# Patient Record
Sex: Male | Born: 1950
Health system: Southern US, Community
[De-identification: ages and names within clinical notes are randomized; demographics above are authoritative.]

## PROBLEM LIST (undated history)

## (undated) DIAGNOSIS — M1990 Unspecified osteoarthritis, unspecified site: Secondary | ICD-10-CM

## (undated) DIAGNOSIS — N183 Chronic kidney disease, stage 3 unspecified: Secondary | ICD-10-CM

## (undated) DIAGNOSIS — E785 Hyperlipidemia, unspecified: Secondary | ICD-10-CM

## (undated) DIAGNOSIS — N400 Enlarged prostate without lower urinary tract symptoms: Secondary | ICD-10-CM

## (undated) DIAGNOSIS — I6523 Occlusion and stenosis of bilateral carotid arteries: Secondary | ICD-10-CM

## (undated) DIAGNOSIS — H3322 Serous retinal detachment, left eye: Secondary | ICD-10-CM

## (undated) DIAGNOSIS — Z973 Presence of spectacles and contact lenses: Secondary | ICD-10-CM

## (undated) DIAGNOSIS — H539 Unspecified visual disturbance: Secondary | ICD-10-CM

## (undated) DIAGNOSIS — R55 Syncope and collapse: Secondary | ICD-10-CM

## (undated) DIAGNOSIS — I6501 Occlusion and stenosis of right vertebral artery: Secondary | ICD-10-CM

## (undated) DIAGNOSIS — I10 Essential (primary) hypertension: Secondary | ICD-10-CM

## (undated) DIAGNOSIS — M779 Enthesopathy, unspecified: Secondary | ICD-10-CM

## (undated) HISTORY — PX: CHOLECYSTECTOMY: SHX55

## (undated) HISTORY — PX: OTHER SURGICAL HISTORY: SHX169

## (undated) HISTORY — PX: COLONOSCOPY: SHX174

## (undated) HISTORY — DX: Enthesopathy, unspecified: M77.9

## (undated) HISTORY — DX: Unspecified visual disturbance: H53.9

## (undated) HISTORY — DX: Benign prostatic hyperplasia without lower urinary tract symptoms: N40.0

## (undated) HISTORY — DX: Hyperlipidemia, unspecified: E78.5

## (undated) HISTORY — PX: KNEE SURGERY: SHX244

## (undated) HISTORY — PX: SHOULDER SURGERY: SHX246

## (undated) HISTORY — DX: Serous retinal detachment, left eye: H33.22

## (undated) HISTORY — DX: Syncope and collapse: R55

## (undated) HISTORY — DX: Chronic kidney disease, stage 3 unspecified: N18.30

---

## 2000-04-24 ENCOUNTER — Encounter: Payer: Self-pay | Admitting: Neurosurgery

## 2000-04-24 ENCOUNTER — Ambulatory Visit (HOSPITAL_COMMUNITY): Admission: RE | Admit: 2000-04-24 | Discharge: 2000-04-24 | Payer: Self-pay | Admitting: Neurosurgery

## 2000-04-27 ENCOUNTER — Encounter: Payer: Self-pay | Admitting: Neurosurgery

## 2000-04-27 ENCOUNTER — Ambulatory Visit (HOSPITAL_COMMUNITY): Admission: RE | Admit: 2000-04-27 | Discharge: 2000-04-27 | Payer: Self-pay | Admitting: Neurosurgery

## 2000-06-07 ENCOUNTER — Encounter: Admission: RE | Admit: 2000-06-07 | Discharge: 2000-09-05 | Payer: Self-pay | Admitting: Anesthesiology

## 2000-09-20 ENCOUNTER — Encounter: Admission: RE | Admit: 2000-09-20 | Discharge: 2000-12-19 | Payer: Self-pay | Admitting: Anesthesiology

## 2010-07-26 ENCOUNTER — Emergency Department (HOSPITAL_COMMUNITY)
Admission: EM | Admit: 2010-07-26 | Discharge: 2010-07-26 | Payer: Self-pay | Source: Home / Self Care | Admitting: Emergency Medicine

## 2012-11-15 ENCOUNTER — Emergency Department (HOSPITAL_COMMUNITY)
Admission: EM | Admit: 2012-11-15 | Discharge: 2012-11-16 | Disposition: A | Discharge status: Home / Self Care | Payer: BC Managed Care – PPO | Attending: Emergency Medicine | Admitting: Emergency Medicine

## 2012-11-15 ENCOUNTER — Emergency Department (HOSPITAL_COMMUNITY): Payer: BC Managed Care – PPO

## 2012-11-15 ENCOUNTER — Encounter (HOSPITAL_COMMUNITY): Payer: Self-pay | Admitting: *Deleted

## 2012-11-15 DIAGNOSIS — S93401A Sprain of unspecified ligament of right ankle, initial encounter: Secondary | ICD-10-CM

## 2012-11-15 DIAGNOSIS — Y9389 Activity, other specified: Secondary | ICD-10-CM | POA: Insufficient documentation

## 2012-11-15 DIAGNOSIS — X500XXA Overexertion from strenuous movement or load, initial encounter: Secondary | ICD-10-CM | POA: Insufficient documentation

## 2012-11-15 DIAGNOSIS — Y929 Unspecified place or not applicable: Secondary | ICD-10-CM | POA: Insufficient documentation

## 2012-11-15 DIAGNOSIS — I10 Essential (primary) hypertension: Secondary | ICD-10-CM | POA: Insufficient documentation

## 2012-11-15 DIAGNOSIS — Z79899 Other long term (current) drug therapy: Secondary | ICD-10-CM | POA: Insufficient documentation

## 2012-11-15 DIAGNOSIS — S93409A Sprain of unspecified ligament of unspecified ankle, initial encounter: Secondary | ICD-10-CM | POA: Insufficient documentation

## 2012-11-15 HISTORY — DX: Essential (primary) hypertension: I10

## 2012-11-15 NOTE — ED Notes (Signed)
Pt stepped in a hole and twisted rt ankle/foot approx 5 hrs prior.

## 2012-11-15 NOTE — ED Provider Notes (Signed)
History     CSN: [id number]  Arrival date & time 11/15/12  2314   First MD Initiated Contact with Patient 11/15/12 2320      Chief Complaint  Patient presents with  . Ankle Pain    rt    (Consider location/radiation/quality/duration/timing/severity/associated sxs/prior treatment) Patient is a 62 y.o. male presenting with ankle pain. The history is provided by the patient.  Ankle Pain Location:  Ankle Injury: yes   Mechanism of injury: fall   Mechanism of injury comment:  Pt stepped in a hole, fell and twisted the right ankle Fall:    Fall occurred:  Down stairs   Impact surface:  Primary school teacher of impact:  Head (right shoulder) Ankle location:  R ankle Pain details:    Quality:  Aching   Radiates to:  Does not radiate   Severity:  Moderate   Onset quality:  Gradual   Timing:  Constant   Progression:  Worsening Chronicity:  New Dislocation: no   Foreign body present:  No foreign bodies Prior injury to area:  Yes Worsened by:  Bearing weight Ineffective treatments:  None tried Associated symptoms: no back pain and no neck pain   Risk factors: no frequent fractures and no recent illness     Past Medical History  Diagnosis Date  . Hypertension     Past Surgical History  Procedure Laterality Date  . Cholecystectomy    . Lt knee    . Rt shoulder      History reviewed. No pertinent family history.  History  Substance Use Topics  . Smoking status: Never Smoker   . Smokeless tobacco: Not on file  . Alcohol Use: No      Review of Systems  Constitutional: Negative for activity change.       All ROS Neg except as noted in HPI  HENT: Negative for nosebleeds and neck pain.   Eyes: Negative for photophobia and discharge.  Respiratory: Negative for cough, shortness of breath and wheezing.   Cardiovascular: Negative for chest pain and palpitations.  Gastrointestinal: Negative for abdominal pain and blood in stool.  Genitourinary: Negative for dysuria,  frequency and hematuria.  Musculoskeletal: Positive for arthralgias. Negative for back pain.  Skin: Negative.   Neurological: Negative for dizziness, seizures and speech difficulty.  Psychiatric/Behavioral: Negative for hallucinations and confusion.    Allergies  Codeine and Flexeril  Home Medications   Current Outpatient Rx  Name  Route  Sig  Dispense  Refill  . amLODipine (NORVASC) 5 MG tablet   Oral   Take 5 mg by mouth daily.         . traMADol (ULTRAM) 50 MG tablet   Oral   Take 50 mg by mouth every 8 (eight) hours as needed for pain.           BP 141/74  Temp(Src) 98.1 F (36.7 C) (Oral)  Resp 18  Ht 6' 4.5" (1.943 m)  Wt 275 lb (124.739 kg)  BMI 33.04 kg/m2  SpO2 97%  Physical Exam  Nursing note and vitals reviewed. Constitutional: He is oriented to person, place, and time. He appears well-developed and well-nourished.  Non-toxic appearance.  HENT:  Head: Normocephalic.  Right Ear: Tympanic membrane and external ear normal.  Left Ear: Tympanic membrane and external ear normal.  Eyes: EOM and lids are normal. Pupils are equal, round, and reactive to light.  Neck: Normal range of motion. Neck supple. Carotid bruit is not present.  Cardiovascular: Normal rate,  regular rhythm, normal heart sounds, intact distal pulses and normal pulses.   Pulmonary/Chest: Breath sounds normal. No respiratory distress.  Abdominal: Soft. Bowel sounds are normal. There is no tenderness. There is no guarding.  Musculoskeletal: Normal range of motion.  There is full range of motion of the right hip and knee. There is mild swelling and pain of the medial malleolus. There is full range of motion of the toes of the right foot. The dorsalis pedis pulse is 2+. The Achilles tendon is intact.  Lymphadenopathy:       Head (right side): No submandibular adenopathy present.       Head (left side): No submandibular adenopathy present.    He has no cervical adenopathy.  Neurological: He is  alert and oriented to person, place, and time. He has normal strength. No cranial nerve deficit or sensory deficit.  Skin: Skin is warm and dry.  Psychiatric: He has a normal mood and affect. His speech is normal.    ED Course  Procedures (including critical care time)  Labs Reviewed - No data to display No results found.   No diagnosis found.    MDM  I have reviewed nursing notes, vital signs, and all appropriate lab and imaging results for this patient. Patient stepped in a hole and twisted the right ankle earlier today. X-ray of the right ankle is negative for fracture or dislocation. Patient given an ice pack. Ankle stirrup splint applied. Crutches were offered but declined. Patient is to see the orthopedic specialist for followup if not improving.      Kathie Dike, PA-C 11/23/12 1520

## 2012-11-25 NOTE — ED Provider Notes (Signed)
Medical screening examination/treatment/procedure(s) were performed by non-physician practitioner and as supervising physician I was immediately available for consultation/collaboration.   Joya Gaskins, MD 11/25/12 2033

## 2013-11-12 ENCOUNTER — Encounter (INDEPENDENT_AMBULATORY_CARE_PROVIDER_SITE_OTHER): Payer: Self-pay | Admitting: *Deleted

## 2016-01-17 DIAGNOSIS — R509 Fever, unspecified: Secondary | ICD-10-CM | POA: Diagnosis not present

## 2016-01-19 DIAGNOSIS — R7989 Other specified abnormal findings of blood chemistry: Secondary | ICD-10-CM | POA: Diagnosis not present

## 2016-01-24 DIAGNOSIS — R799 Abnormal finding of blood chemistry, unspecified: Secondary | ICD-10-CM | POA: Diagnosis not present

## 2016-03-02 DIAGNOSIS — B029 Zoster without complications: Secondary | ICD-10-CM | POA: Diagnosis not present

## 2016-03-02 DIAGNOSIS — R509 Fever, unspecified: Secondary | ICD-10-CM | POA: Diagnosis not present

## 2016-03-02 DIAGNOSIS — R799 Abnormal finding of blood chemistry, unspecified: Secondary | ICD-10-CM | POA: Diagnosis not present

## 2016-07-11 DIAGNOSIS — R972 Elevated prostate specific antigen [PSA]: Secondary | ICD-10-CM | POA: Diagnosis not present

## 2016-07-11 DIAGNOSIS — N401 Enlarged prostate with lower urinary tract symptoms: Secondary | ICD-10-CM | POA: Diagnosis not present

## 2016-07-11 DIAGNOSIS — R3911 Hesitancy of micturition: Secondary | ICD-10-CM | POA: Diagnosis not present

## 2017-07-17 DIAGNOSIS — M722 Plantar fascial fibromatosis: Secondary | ICD-10-CM | POA: Diagnosis not present

## 2017-07-17 DIAGNOSIS — M79672 Pain in left foot: Secondary | ICD-10-CM | POA: Diagnosis not present

## 2017-09-11 DIAGNOSIS — M722 Plantar fascial fibromatosis: Secondary | ICD-10-CM | POA: Diagnosis not present

## 2017-10-16 DIAGNOSIS — M722 Plantar fascial fibromatosis: Secondary | ICD-10-CM | POA: Diagnosis not present

## 2017-11-08 DIAGNOSIS — I10 Essential (primary) hypertension: Secondary | ICD-10-CM | POA: Diagnosis not present

## 2017-11-08 DIAGNOSIS — Z125 Encounter for screening for malignant neoplasm of prostate: Secondary | ICD-10-CM | POA: Diagnosis not present

## 2017-11-08 DIAGNOSIS — E7849 Other hyperlipidemia: Secondary | ICD-10-CM | POA: Diagnosis not present

## 2017-11-15 DIAGNOSIS — D696 Thrombocytopenia, unspecified: Secondary | ICD-10-CM | POA: Diagnosis not present

## 2017-11-15 DIAGNOSIS — Z1389 Encounter for screening for other disorder: Secondary | ICD-10-CM | POA: Diagnosis not present

## 2017-11-15 DIAGNOSIS — E668 Other obesity: Secondary | ICD-10-CM | POA: Diagnosis not present

## 2017-11-15 DIAGNOSIS — Z6830 Body mass index (BMI) 30.0-30.9, adult: Secondary | ICD-10-CM | POA: Diagnosis not present

## 2017-11-15 DIAGNOSIS — Z0000 Encounter for general adult medical examination without abnormal findings: Secondary | ICD-10-CM | POA: Diagnosis not present

## 2017-11-15 DIAGNOSIS — Z8249 Family history of ischemic heart disease and other diseases of the circulatory system: Secondary | ICD-10-CM | POA: Diagnosis not present

## 2017-11-15 DIAGNOSIS — N400 Enlarged prostate without lower urinary tract symptoms: Secondary | ICD-10-CM | POA: Diagnosis not present

## 2017-11-15 DIAGNOSIS — M545 Low back pain: Secondary | ICD-10-CM | POA: Diagnosis not present

## 2017-11-15 DIAGNOSIS — I10 Essential (primary) hypertension: Secondary | ICD-10-CM | POA: Diagnosis not present

## 2017-11-15 DIAGNOSIS — E7849 Other hyperlipidemia: Secondary | ICD-10-CM | POA: Diagnosis not present

## 2017-11-15 DIAGNOSIS — H6123 Impacted cerumen, bilateral: Secondary | ICD-10-CM | POA: Diagnosis not present

## 2017-11-15 DIAGNOSIS — N183 Chronic kidney disease, stage 3 (moderate): Secondary | ICD-10-CM | POA: Diagnosis not present

## 2018-05-14 DIAGNOSIS — M7712 Lateral epicondylitis, left elbow: Secondary | ICD-10-CM | POA: Diagnosis not present

## 2018-05-14 DIAGNOSIS — I10 Essential (primary) hypertension: Secondary | ICD-10-CM | POA: Diagnosis not present

## 2018-05-14 DIAGNOSIS — N183 Chronic kidney disease, stage 3 (moderate): Secondary | ICD-10-CM | POA: Diagnosis not present

## 2018-05-14 DIAGNOSIS — M545 Low back pain: Secondary | ICD-10-CM | POA: Diagnosis not present

## 2018-05-14 DIAGNOSIS — D696 Thrombocytopenia, unspecified: Secondary | ICD-10-CM | POA: Diagnosis not present

## 2018-05-14 DIAGNOSIS — Z6830 Body mass index (BMI) 30.0-30.9, adult: Secondary | ICD-10-CM | POA: Diagnosis not present

## 2018-05-14 DIAGNOSIS — E7849 Other hyperlipidemia: Secondary | ICD-10-CM | POA: Diagnosis not present

## 2018-05-14 DIAGNOSIS — Z23 Encounter for immunization: Secondary | ICD-10-CM | POA: Diagnosis not present

## 2018-10-02 DIAGNOSIS — R972 Elevated prostate specific antigen [PSA]: Secondary | ICD-10-CM | POA: Diagnosis not present

## 2018-10-07 DIAGNOSIS — R972 Elevated prostate specific antigen [PSA]: Secondary | ICD-10-CM | POA: Diagnosis not present

## 2018-10-07 DIAGNOSIS — R3911 Hesitancy of micturition: Secondary | ICD-10-CM | POA: Diagnosis not present

## 2018-10-07 DIAGNOSIS — N401 Enlarged prostate with lower urinary tract symptoms: Secondary | ICD-10-CM | POA: Diagnosis not present

## 2018-11-15 DIAGNOSIS — E7849 Other hyperlipidemia: Secondary | ICD-10-CM | POA: Diagnosis not present

## 2018-11-15 DIAGNOSIS — I10 Essential (primary) hypertension: Secondary | ICD-10-CM | POA: Diagnosis not present

## 2018-11-15 DIAGNOSIS — R82998 Other abnormal findings in urine: Secondary | ICD-10-CM | POA: Diagnosis not present

## 2018-11-15 DIAGNOSIS — Z125 Encounter for screening for malignant neoplasm of prostate: Secondary | ICD-10-CM | POA: Diagnosis not present

## 2018-11-22 DIAGNOSIS — D696 Thrombocytopenia, unspecified: Secondary | ICD-10-CM | POA: Diagnosis not present

## 2018-11-22 DIAGNOSIS — Z8249 Family history of ischemic heart disease and other diseases of the circulatory system: Secondary | ICD-10-CM | POA: Diagnosis not present

## 2018-11-22 DIAGNOSIS — N183 Chronic kidney disease, stage 3 (moderate): Secondary | ICD-10-CM | POA: Diagnosis not present

## 2018-11-22 DIAGNOSIS — E669 Obesity, unspecified: Secondary | ICD-10-CM | POA: Diagnosis not present

## 2018-11-22 DIAGNOSIS — N400 Enlarged prostate without lower urinary tract symptoms: Secondary | ICD-10-CM | POA: Diagnosis not present

## 2018-11-22 DIAGNOSIS — E785 Hyperlipidemia, unspecified: Secondary | ICD-10-CM | POA: Diagnosis not present

## 2018-11-22 DIAGNOSIS — Z0000 Encounter for general adult medical examination without abnormal findings: Secondary | ICD-10-CM | POA: Diagnosis not present

## 2018-11-22 DIAGNOSIS — M545 Low back pain: Secondary | ICD-10-CM | POA: Diagnosis not present

## 2018-11-22 DIAGNOSIS — I10 Essential (primary) hypertension: Secondary | ICD-10-CM | POA: Diagnosis not present

## 2018-11-22 DIAGNOSIS — J309 Allergic rhinitis, unspecified: Secondary | ICD-10-CM | POA: Diagnosis not present

## 2018-11-22 DIAGNOSIS — Z1331 Encounter for screening for depression: Secondary | ICD-10-CM | POA: Diagnosis not present

## 2018-11-22 DIAGNOSIS — M1990 Unspecified osteoarthritis, unspecified site: Secondary | ICD-10-CM | POA: Diagnosis not present

## 2019-02-17 DIAGNOSIS — N183 Chronic kidney disease, stage 3 (moderate): Secondary | ICD-10-CM | POA: Diagnosis not present

## 2019-02-17 DIAGNOSIS — R55 Syncope and collapse: Secondary | ICD-10-CM | POA: Diagnosis not present

## 2019-02-17 DIAGNOSIS — I129 Hypertensive chronic kidney disease with stage 1 through stage 4 chronic kidney disease, or unspecified chronic kidney disease: Secondary | ICD-10-CM | POA: Diagnosis not present

## 2019-02-17 DIAGNOSIS — E785 Hyperlipidemia, unspecified: Secondary | ICD-10-CM | POA: Diagnosis not present

## 2019-02-17 DIAGNOSIS — Z8249 Family history of ischemic heart disease and other diseases of the circulatory system: Secondary | ICD-10-CM | POA: Diagnosis not present

## 2019-02-19 ENCOUNTER — Other Ambulatory Visit: Payer: Self-pay | Admitting: Internal Medicine

## 2019-02-19 ENCOUNTER — Ambulatory Visit
Admission: RE | Admit: 2019-02-19 | Discharge: 2019-02-19 | Disposition: A | Discharge status: Home / Self Care | Payer: Medicare Other | Source: Ambulatory Visit | Attending: Internal Medicine | Admitting: Internal Medicine

## 2019-02-19 ENCOUNTER — Ambulatory Visit
Admission: RE | Admit: 2019-02-19 | Discharge: 2019-02-19 | Disposition: A | Discharge status: Home / Self Care | Payer: Self-pay | Source: Ambulatory Visit | Attending: Internal Medicine | Admitting: Internal Medicine

## 2019-02-19 DIAGNOSIS — R55 Syncope and collapse: Secondary | ICD-10-CM

## 2019-02-19 DIAGNOSIS — R51 Headache: Secondary | ICD-10-CM | POA: Diagnosis not present

## 2019-02-19 DIAGNOSIS — I6501 Occlusion and stenosis of right vertebral artery: Secondary | ICD-10-CM | POA: Diagnosis not present

## 2019-02-19 DIAGNOSIS — I6523 Occlusion and stenosis of bilateral carotid arteries: Secondary | ICD-10-CM | POA: Diagnosis not present

## 2019-02-19 MED ORDER — IOPAMIDOL (ISOVUE-370) INJECTION 76%
50.0000 mL | Freq: Once | INTRAVENOUS | Status: AC | PRN
  Administered 2019-02-19: 50 mL via INTRAVENOUS

## 2019-02-21 ENCOUNTER — Encounter: Payer: Self-pay | Admitting: Cardiology

## 2019-02-21 ENCOUNTER — Ambulatory Visit (INDEPENDENT_AMBULATORY_CARE_PROVIDER_SITE_OTHER): Payer: Medicare Other | Admitting: Cardiology

## 2019-02-21 ENCOUNTER — Other Ambulatory Visit: Payer: Self-pay

## 2019-02-21 ENCOUNTER — Ambulatory Visit: Payer: Medicare Other

## 2019-02-21 VITALS — BP 145/70 | HR 61 | Temp 98.7°F | Ht 76.0 in | Wt 250.7 lb

## 2019-02-21 DIAGNOSIS — I10 Essential (primary) hypertension: Secondary | ICD-10-CM | POA: Diagnosis not present

## 2019-02-21 DIAGNOSIS — R42 Dizziness and giddiness: Secondary | ICD-10-CM

## 2019-02-21 DIAGNOSIS — E782 Mixed hyperlipidemia: Secondary | ICD-10-CM

## 2019-02-21 DIAGNOSIS — R55 Syncope and collapse: Secondary | ICD-10-CM

## 2019-02-21 MED ORDER — ATORVASTATIN CALCIUM 10 MG PO TABS
10.0000 mg | ORAL_TABLET | Freq: Every day | ORAL | 3 refills | Status: DC
Start: 2019-02-21 — End: 2019-07-17

## 2019-02-21 NOTE — Progress Notes (Signed)
Patient referred by [name], MD for presyncope  Subjective:   Steven Padilla, male    DOB: [birth year], 68 y.o.   MRN: [id number]   Chief Complaint  Patient presents with  . Presyncope  . Hypertension  . New Patient (Initial Visit)   HPI  68 y.o. Caucasian male with hypertension, hyperlipidemia, family h/o brain aneurysm, referred for evaluation of presyncope.  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 With this level of activity, he does not have any chest pain. shortness of breath symptoms. A few weeks ago, he was working on fixing a cooler, looking overhead, when he had sudden onset of blackout episode. He did not lose consciousness or fall to the ground. He was able to hold onto a scaffolding. The episode resolved in 10-15 seconds. He felt tired rest of the day. He underwent CTA head and neck, which showed mild atherosclerosis, but no specific etiology for his episodes.   Past Medical History:  Diagnosis Date  . Hypertension      Past Surgical History:  Procedure Laterality Date  . CHOLECYSTECTOMY    . lt knee    . rt shoulder       Social History   Socioeconomic History  . Marital status: Married    Spouse name: Not on file  . Number of children: Not on file  . Years of education: Not on file  . Highest education level: Not on file  Occupational History  . Not on file  Social Needs  . Financial resource strain: Not on file  . Food insecurity    Worry: Not on file    Inability: Not on file  . Transportation needs    Medical: Not on file    Non-medical: Not on file  Tobacco Use  . Smoking status: Never Smoker  Substance and Sexual Activity  . Alcohol use: No  . Drug use: Not on file  . Sexual activity: Not on file  Lifestyle  . Physical activity    Days per week: Not on file    Minutes per session: Not on file  . Stress: Not  on file  Relationships  . Social Herbalist on phone: Not on file    Gets together: Not on file    Attends religious service: Not on file    Active member of club or organization: Not on file    Attends meetings of clubs or organizations: Not on file    Relationship status: Not on file  . Intimate partner violence    Fear of current or ex partner: Not on file    Emotionally abused: Not on file    Physically abused: Not on file    Forced sexual activity: Not on file  Other Topics Concern  . Not on file  Social History Narrative  . Not on file     Family History  Problem Relation Age of Onset  . Hypertension Mother   . Hypertension Father   . Hypertension Brother      Current Outpatient Medications on File Prior to Visit  Medication Sig Dispense Refill  . amLODipine (NORVASC) 5 MG tablet Take 5 mg by mouth daily.    . traMADol (ULTRAM) 50 MG tablet Take 50 mg by mouth every 8 (eight) hours as needed for pain.     No current facility-administered medications on  file prior to visit.     Cardiovascular studies:  EKG 02/21/2019: Sinus bradycardia 50 bpm. Borderline first degree AV block.  CTA head/neck 02/19/2019: Normal appearance of the brain itself. Aortic atherosclerosis. Atherosclerotic disease at both carotid bifurcation and ICA bulb regions but without stenosis. Atherosclerotic plaque of both common carotid arteries, 40% stenosis on the right. Less than 20% stenosis on the left. Atherosclerotic disease in both carotid siphon regions. 40% stenosis of the right supraclinoid ICA. No stenosis on the left greater than 20%. No stenosis or occlusion of the anterior circulation branch vessels. 50-70% stenosis of the right vertebral artery origin but wide patency beyond that to the basilar. Left vertebral artery arises from the arch, without stenosis and is patent to the basilar. I do not see vascular disease of the degree that I would expect to result in  syncopal episodes.  Recent labs: 11/15/2018: Glucose 105. BUN/Cr 27/1.5. eGFR 56. Na/K 142/4.8. H/H 13.8/95. MCV 95. Platelets 125.  Chol 204, TG 67, HDL 43, LDL 148   Review of Systems  Constitution: Negative for decreased appetite, malaise/fatigue, weight gain and weight loss.  HENT: Negative for congestion.   Eyes: Negative for visual disturbance.  Cardiovascular: Negative for chest pain, dyspnea on exertion, leg swelling, palpitations and syncope.  Respiratory: Negative for cough.   Endocrine: Negative for cold intolerance.  Hematologic/Lymphatic: Does not bruise/bleed easily.  Skin: Negative for itching and rash.  Musculoskeletal: Negative for myalgias.  Gastrointestinal: Negative for abdominal pain, nausea and vomiting.  Genitourinary: Positive for frequency. Negative for dysuria.  Neurological: Positive for dizziness and light-headedness. Negative for weakness.  Psychiatric/Behavioral: The patient is not nervous/anxious.   All other systems reviewed and are negative.        Vitals:   02/21/19 0959  BP: (!) 145/70  Pulse: 61  Temp: 98.7 F (37.1 C)  SpO2: 97%     Body mass index is 30.52 kg/m. Filed Weights   02/21/19 0959  Weight: 250 lb 11.2 oz (113.7 kg)     Objective:   Physical Exam  Constitutional: He is oriented to person, place, and time. He appears well-developed and well-nourished. No distress.  HENT:  Head: Normocephalic and atraumatic.  Eyes: Pupils are equal, round, and reactive to light. Conjunctivae are normal.  Neck: No JVD present.  Cardiovascular: Normal rate, regular rhythm and intact distal pulses.  No murmur heard. Pulmonary/Chest: Effort normal and breath sounds normal. He has no wheezes. He has no rales.  Abdominal: Soft. Bowel sounds are normal. There is no rebound.  Musculoskeletal:        General: No edema.  Lymphadenopathy:    He has no cervical adenopathy.  Neurological: He is alert and oriented to person, place, and time.  No cranial nerve deficit.  Skin: Skin is warm and dry.  Psychiatric: He has a normal mood and affect.  Nursing note and vitals reviewed.         Assessment & Recommendations:   68 y.o. Caucasian male with hypertension, hyperlipidemia, family h/o brain aneurysm, referred for evaluation of presyncope.  Presyncope: Normal physical exam. Resting EKG with sinus bradycardia. CTA head/neck with mild atherosclerosis, no aneurysm or specific etiology to explain his episode. I suspect his episode was likely related to mild orthostasis. Will obtain echocardiogram and event monitor to rule out structural abnormality or arrhthymias-especially bradyarrhythmia. Recommend liberal hydration. Discussed counterpressure maneuvers.  Hyperlipidemia: Elevated ASCVD risk. Recommend statin. He has been intolerant to crestor before. Started lipitor 10 mg daily.  Thank you for referring the patient to [place]. Please feel free to contact with any questions.  Nigel Mormon, MD [facility] Pager: [phone] Office: [phone] If no answer Cell [phone]

## 2019-02-22 ENCOUNTER — Encounter: Payer: Self-pay | Admitting: Cardiology

## 2019-02-22 DIAGNOSIS — E782 Mixed hyperlipidemia: Secondary | ICD-10-CM | POA: Insufficient documentation

## 2019-02-24 DIAGNOSIS — E7849 Other hyperlipidemia: Secondary | ICD-10-CM | POA: Diagnosis not present

## 2019-02-24 DIAGNOSIS — R82998 Other abnormal findings in urine: Secondary | ICD-10-CM | POA: Diagnosis not present

## 2019-03-05 ENCOUNTER — Encounter: Payer: Self-pay | Admitting: *Deleted

## 2019-03-06 ENCOUNTER — Encounter: Payer: Self-pay | Admitting: Neurology

## 2019-03-06 ENCOUNTER — Telehealth: Payer: Self-pay | Admitting: Neurology

## 2019-03-06 ENCOUNTER — Other Ambulatory Visit: Payer: Self-pay

## 2019-03-06 ENCOUNTER — Ambulatory Visit (INDEPENDENT_AMBULATORY_CARE_PROVIDER_SITE_OTHER): Payer: Medicare Other | Admitting: Neurology

## 2019-03-06 VITALS — BP 127/67 | HR 61 | Temp 98.6°F | Ht 76.0 in | Wt 249.0 lb

## 2019-03-06 DIAGNOSIS — H538 Other visual disturbances: Secondary | ICD-10-CM | POA: Diagnosis not present

## 2019-03-06 NOTE — Telephone Encounter (Signed)
Medicare/aetna supp order sent to GI. No auth they will reach out to the patient to schedule.  

## 2019-03-06 NOTE — Progress Notes (Signed)
PATIENT: Steven Padilla DOB: 1950/09/17  Chief Complaint  Patient presents with  . Blacking out spells    Reports three episodes of feeling dizzy when looking up and vision going black.  Symptoms lasted about 15 seconds.  He is concerned due to his brother passing away from an aneurysm.   Marland Kitchen PCP    [name], MD     HISTORICAL  Steven Padilla is a 68 years old male, seen in request by his primary care physician Dr. Dagmar Hait, Steva Ready for evaluation of blacking out spells, initial evaluation was on March 06, 2019.  I have reviewed and summarized the referring note from the referring physician.  He had past medical history of hypertension, hyperlipidemia, he is the owner of apple orchard, works out side regularly, in the middle of August 2020, while working on the storage room for his apple, he stood on the bench, felt sudden onset bilateral blurred vision, he was able to get down, holding on the scaffold by the site, lasting 10 to 15 seconds, then his vision came back, while in a standing position, he had a second episode bilateral vision went black, again lasted about 10 seconds, he was able to sit down, had a third episode, he denied loss of consciousness, no chest pain, heart palpitation,  He has been taking aspirin 81 mg daily  I have reviewed CT angiogram of February 19, 2019, normal appearance of brain, and her carotid artherosclerotic disease, 40% stenosis on the right side, less than 20% stenosis on the left side; 50 to 70% stenosis of right vertebral artery,  He is wearing 30 days cardiac monitoring, echocardiogram is pending  REVIEW OF SYSTEMS: Full 14 system review of systems performed and notable only for as above All other review of systems were negative.  ALLERGIES: Allergies  Allergen Reactions  . Codeine Anxiety  . Flexeril [Cyclobenzaprine]     HOME MEDICATIONS: Current Outpatient Medications  Medication Sig Dispense Refill  . amLODipine (NORVASC) 5 MG  tablet Take 5 mg by mouth daily.    Marland Kitchen aspirin EC 81 MG tablet Take 1 tablet by mouth daily.    Marland Kitchen atorvastatin (LIPITOR) 10 MG tablet Take 1 tablet (10 mg total) by mouth daily. 30 tablet 3  . fluticasone (FLONASE) 50 MCG/ACT nasal spray Place into the nose.    . irbesartan (AVAPRO) 300 MG tablet Take 1 tablet by mouth daily.    . traMADol (ULTRAM) 50 MG tablet Take 50 mg by mouth every 8 (eight) hours as needed for pain.     No current facility-administered medications for this visit.     PAST MEDICAL HISTORY: Past Medical History:  Diagnosis Date  . Bone spur   . BPH with elevated PSA   . CKD (chronic kidney disease), stage III ([place])   . Detached retina, left   . Hyperlipemia   . Hypertension   . Syncope   . Vision disturbance     PAST SURGICAL HISTORY: Past Surgical History:  Procedure Laterality Date  . CHOLECYSTECTOMY    . lt knee    . rt shoulder      FAMILY HISTORY: Family History  Problem Relation Age of Onset  . Hypertension Mother   . Hypertension Father   . Hypertension Brother   . Aneurysm Brother     SOCIAL HISTORY: Social History   Socioeconomic History  . Marital status: Married    Spouse name: Not on file  . Number of children: 2  .  Years of education: some college  . Highest education level: Not on file  Occupational History  . Occupation: owns apple orchard  . Occupation: retired Engineer, structuralfire chief  Social Needs  . Financial resource strain: Not on file  . Food insecurity    Worry: Not on file    Inability: Not on file  . Transportation needs    Medical: Not on file    Non-medical: Not on file  Tobacco Use  . Smoking status: Former Smoker    Packs/day: 2.00    Years: 20.00    Pack years: 40.00    Quit date: 1983    Years since quitting: 37.7  . Smokeless tobacco: Former Engineer, waterUser  Substance and Sexual Activity  . Alcohol use: No  . Drug use: Not Currently  . Sexual activity: Not on file  Lifestyle  . Physical activity    Days per week: Not  on file    Minutes per session: Not on file  . Stress: Not on file  Relationships  . Social Musicianconnections    Talks on phone: Not on file    Gets together: Not on file    Attends religious service: Not on file    Active member of club or organization: Not on file    Attends meetings of clubs or organizations: Not on file    Relationship status: Not on file  . Intimate partner violence    Fear of current or ex partner: Not on file    Emotionally abused: Not on file    Physically abused: Not on file    Forced sexual activity: Not on file  Other Topics Concern  . Not on file  Social History Narrative   Lives at home with his wife.   Ambidextrous.   Caffeine use:  One cup per day.     PHYSICAL EXAM   Vitals:   03/06/19 1318  BP: 127/67  Pulse: 61  Temp: 98.6 F (37 C)  Weight: 249 lb (112.9 kg)  Height: 6\' 4"  (1.93 m)    Not recorded      Body mass index is 30.31 kg/m.  PHYSICAL EXAMNIATION:  Gen: NAD, conversant, well nourised, well groomed                     Cardiovascular: Regular rate rhythm, no peripheral edema, warm, nontender. Eyes: Conjunctivae clear without exudates or hemorrhage Neck: Supple, no carotid bruits. Pulmonary: Clear to auscultation bilaterally   NEUROLOGICAL EXAM:  MENTAL STATUS: Speech:    Speech is normal; fluent and spontaneous with normal comprehension.  Cognition:     Orientation to time, place and person     Normal recent and remote memory     Normal Attention span and concentration     Normal Language, naming, repeating,spontaneous speech     Fund of knowledge   CRANIAL NERVES: CN II: Visual fields are full to confrontation.  Pupils are round equal and briskly reactive to light. CN III, IV, VI: extraocular movement are normal. No ptosis. CN V: Facial sensation is intact to pinprick in all 3 divisions bilaterally. Corneal responses are intact.  CN VII: Face is symmetric with normal eye closure and smile. CN VIII: Hearing is  normal to causal conversation. CN IX, X: Palate elevates symmetrically. Phonation is normal. CN XI: Head turning and shoulder shrug are intact CN XII: Tongue is midline with normal movements and no atrophy.  MOTOR: There is no pronator drift of out-stretched arms. Muscle bulk and  tone are normal. Muscle strength is normal.  REFLEXES: Reflexes are 2+ and symmetric at the biceps, triceps, knees, and ankles. Plantar responses are flexor.  SENSORY: Intact to light touch, pinprick, positional sensation and vibratory sensation are intact in fingers and toes.  COORDINATION: Rapid alternating movements and fine finger movements are intact. There is no dysmetria on finger-to-nose and heel-knee-shin.    GAIT/STANCE: Posture is normal. Gait is steady with normal steps, base, arm swing, and turning. Heel and toe walking are normal. Mild difficulty with tandem walking Romberg is absent.   DIAGNOSTIC DATA (LABS, IMAGING, TESTING) - I reviewed patient records, labs, notes, testing and imaging myself where available.   ASSESSMENT AND PLAN  LANDER HEMAUER is a 68 y.o. male   Bilateral blurry vision  Most consistent with hypoperfusion of brain, continue to work up with cardiologist,  Semiology does not suggestive of seizure, but will still proceed with MRI of the brain, EEG, to rule out central nervous system etiology Bilateral internal carotid artery disease  Continue aspirin 81 mg daily,  Optimize blood pressure and hyperlipidemia control, increase water intake   Levert Feinstein, M.D. Ph.D.  [facility] [address], [address] [place], [place] [address] Ph: [phone] Fax: [phone]  CC: Chilton Greathouse, MD

## 2019-03-12 ENCOUNTER — Other Ambulatory Visit: Payer: Self-pay

## 2019-03-12 ENCOUNTER — Ambulatory Visit (INDEPENDENT_AMBULATORY_CARE_PROVIDER_SITE_OTHER): Payer: Medicare Other

## 2019-03-12 DIAGNOSIS — R55 Syncope and collapse: Secondary | ICD-10-CM | POA: Diagnosis not present

## 2019-03-12 DIAGNOSIS — R42 Dizziness and giddiness: Secondary | ICD-10-CM | POA: Diagnosis not present

## 2019-03-13 NOTE — Progress Notes (Signed)
Spoke with patient about results. Patient verbalized understanding.

## 2019-03-26 ENCOUNTER — Other Ambulatory Visit: Payer: Self-pay

## 2019-03-26 ENCOUNTER — Ambulatory Visit (INDEPENDENT_AMBULATORY_CARE_PROVIDER_SITE_OTHER): Payer: Medicare Other | Admitting: Neurology

## 2019-03-26 DIAGNOSIS — R2990 Unspecified symptoms and signs involving the nervous system: Secondary | ICD-10-CM

## 2019-03-26 DIAGNOSIS — H538 Other visual disturbances: Secondary | ICD-10-CM

## 2019-03-27 NOTE — Procedures (Signed)
   HISTORY: 68 years old male, presenting with blanking out spells  TECHNIQUE:  This is a routine 16 channel EEG recording with one channel devoted to a limited EKG recording.  It was performed during wakefulness, drowsiness and asleep.  Hyperventilation and photic stimulation were performed as activating procedures.  There are minimum muscle and movement artifact noted.  Upon maximum arousal, posterior dominant waking rhythm consistent of rhythmic alpha range activity, with frequency of 9 hz. Activities are symmetric over the bilateral posterior derivations and attenuated with eye opening.  Hyperventilation produced mild/moderate buildup with higher amplitude and the slower activities noted.  Photic stimulation did not alter the tracing.  During EEG recording, patient developed drowsiness and no deeper stage of essentially was achieved During EEG recording, there was no epileptiform discharge noted.  EKG demonstrate sinus rhythm, with heart rate of  CONCLUSION: This is a  normal awake EEG.  There is no electrodiagnostic evidence of epileptiform discharge.  Marcial Pacas, M.D. Ph.D.  [facility] [place], [place] [address] Phone: [phone] Fax:      [phone]

## 2019-03-29 NOTE — Progress Notes (Signed)
Patient referred by [name], MD for presyncope  I connected with the patient on 04/05/2019 by a telephone call and verified that I am speaking with the correct person using two identifiers.     I offered the patient a video enabled application for a virtual visit. Unfortunately, this could not be accomplished due to technical difficulties/lack of video enabled phone/computer. I discussed the limitations of evaluation and management by telemedicine and the availability of in person appointments. The patient expressed understanding and agreed to proceed.   This visit type was conducted due to national recommendations for restrictions regarding the COVID-19 Pandemic (e.g. social distancing).  This format is felt to be most appropriate for this patient at this time.  All issues noted in this document were discussed and addressed.  No physical exam was performed (except for noted visual exam findings with Tele health visits).  The patient has consented to conduct a Tele health visit and understands insurance will be billed.    Subjective:   Steven Padilla, male    DOB: June 16, 1951, 68 y.o.   MRN: [id number]   Chief Complaint  Patient presents with   Presyncope   HPI  68 y.o. Caucasian male with hypertension, hyperlipidemia, family h/o brain aneurysm, referred for evaluation of presyncope.  Patient has noticed that he has not had any episodes of presyncope in the recent times since weather has gotten colder.  He is walking every day without any symptoms.  Blood pressure is usually much lower than what it is today.  He is compliant with his medical therapy.  Work-up with echocardiogram and event monitor was unremarkable, details below.  Past Medical History:  Diagnosis Date   Bone spur    BPH with elevated PSA    CKD (chronic kidney disease), stage III (HCC)    Detached retina, left    Hyperlipemia    Hypertension    Syncope    Vision disturbance      Past  Surgical History:  Procedure Laterality Date   CHOLECYSTECTOMY     lt knee     rt shoulder       Social History   Socioeconomic History   Marital status: Married    Spouse name: Not on file   Number of children: 2   Years of education: some college   Highest education level: Not on file  Occupational History   Occupation: owns apple orchard   Occupation: retired Estate agent strain: Not on file   Food insecurity    Worry: Not on file    Inability: Not on Lexicographer needs    Medical: Not on file    Non-medical: Not on file  Tobacco Use   Smoking status: Former Smoker    Packs/day: 2.00    Years: 20.00    Pack years: 40.00    Quit date: 1983    Years since quitting: 37.7   Smokeless tobacco: Former Network engineer and Sexual Activity   Alcohol use: No   Drug use: Not Currently   Sexual activity: Not on file  Lifestyle   Physical activity    Days per week: Not on file    Minutes per session: Not on file   Stress: Not on file  Relationships   Social connections    Talks on phone: Not on file    Gets together: Not on file    Attends religious service: Not on file  Active member of club or organization: Not on file    Attends meetings of clubs or organizations: Not on file    Relationship status: Not on file   Intimate partner violence    Fear of current or ex partner: Not on file    Emotionally abused: Not on file    Physically abused: Not on file    Forced sexual activity: Not on file  Other Topics Concern   Not on file  Social History Narrative   Lives at home with his wife.   Ambidextrous.   Caffeine use:  One cup per day.     Family History  Problem Relation Age of Onset   Hypertension Mother    Hypertension Father    Hypertension Brother    Aneurysm Brother      Current Outpatient Medications on File Prior to Visit  Medication Sig Dispense Refill   amLODipine (NORVASC)  5 MG tablet Take 5 mg by mouth daily.     aspirin EC 81 MG tablet Take 1 tablet by mouth daily.     atorvastatin (LIPITOR) 10 MG tablet Take 1 tablet (10 mg total) by mouth daily. 30 tablet 3   fluticasone (FLONASE) 50 MCG/ACT nasal spray Place into the nose.     irbesartan (AVAPRO) 300 MG tablet Take 1 tablet by mouth daily.     traMADol (ULTRAM) 50 MG tablet Take 50 mg by mouth every 8 (eight) hours as needed for pain.     No current facility-administered medications on file prior to visit.     Cardiovascular studies:  Event monitor 02/21/2019 - 03/22/2019: Dominant rhythm sinus. HR 38-120 bpm. One episode of skipped beats correlated with normal sinus rhythm. No arrhythmia noted.   Echocardiogram 03/12/2019: Left ventricle cavity is normal in size. Mild concentric hypertrophy of the left ventricle. Normal LV systolic function with EF 68%. Normal global wall motion. Doppler evidence of grade I (impaired) diastolic dysfunction, normal LAP.  Left atrial cavity is mildly dilated. Aneurysmal interatrial septum without 2D or color Doppler evidence of interatrial shunt. Trileaflet aortic valve. Mild aortic valve leaflet calcification. Mild (Grade I) aortic regurgitation. Mild pulmonic regurgitation. No evidence of pulmonary hypertension.  EKG 02/21/2019: Sinus bradycardia 50 bpm. Borderline first degree AV block.  CTA head/neck 02/19/2019: Normal appearance of the brain itself. Aortic atherosclerosis. Atherosclerotic disease at both carotid bifurcation and ICA bulb regions but without stenosis. Atherosclerotic plaque of both common carotid arteries, 40% stenosis on the right. Less than 20% stenosis on the left. Atherosclerotic disease in both carotid siphon regions. 40% stenosis of the right supraclinoid ICA. No stenosis on the left greater than 20%. No stenosis or occlusion of the anterior circulation branch vessels. 50-70% stenosis of the right vertebral artery origin but  wide patency beyond that to the basilar. Left vertebral artery arises from the arch, without stenosis and is patent to the basilar. I do not see vascular disease of the degree that I would expect to result in syncopal episodes.  Recent labs: 11/15/2018: Glucose 105. BUN/Cr 27/1.5. eGFR 56. Na/K 142/4.8. H/H 13.8/95. MCV 95. Platelets 125.  Chol 204, TG 67, HDL 43, LDL 148   Review of Systems  Constitution: Negative for decreased appetite, malaise/fatigue, weight gain and weight loss.  HENT: Negative for congestion.   Eyes: Negative for visual disturbance.  Cardiovascular: Negative for chest pain, dyspnea on exertion, leg swelling, palpitations and syncope.  Respiratory: Negative for cough.   Endocrine: Negative for cold intolerance.  Hematologic/Lymphatic: Does not bruise/bleed  easily.  Skin: Negative for itching and rash.  Musculoskeletal: Negative for myalgias.  Gastrointestinal: Negative for abdominal pain, nausea and vomiting.  Genitourinary: Positive for frequency. Negative for dysuria.  Neurological: Positive for dizziness and light-headedness. Negative for weakness.  Psychiatric/Behavioral: The patient is not nervous/anxious.   All other systems reviewed and are negative.        Vitals:   04/03/19 1316 04/03/19 1318  BP: (!) 158/80 (!) 142/77  Pulse:  (!) 56     Objective:   Physical Exam  Not performed. Telephone note.        Assessment & Recommendations:   68 y.o. Caucasian male with hypertension, hyperlipidemia, family h/o brain aneurysm, referred for evaluation of presyncope.  Presyncope: Reassuring echocardiogram and event monitor. Low suspicion for angina at this time. Continue management for hypertension.   Hyperlipidemia: Tolerating Lipitor 10 mg daily. Repeat lipid panel in 3 months.   Nigel Mormon, MD [facility] Pager: [phone] Office: [phone] If no answer Cell [phone]

## 2019-03-31 ENCOUNTER — Other Ambulatory Visit: Payer: Self-pay

## 2019-03-31 ENCOUNTER — Ambulatory Visit
Admission: RE | Admit: 2019-03-31 | Discharge: 2019-03-31 | Disposition: A | Discharge status: Home / Self Care | Payer: Medicare Other | Source: Ambulatory Visit | Attending: Neurology | Admitting: Neurology

## 2019-03-31 DIAGNOSIS — H538 Other visual disturbances: Secondary | ICD-10-CM

## 2019-04-03 ENCOUNTER — Telehealth (INDEPENDENT_AMBULATORY_CARE_PROVIDER_SITE_OTHER): Payer: Medicare Other | Admitting: Cardiology

## 2019-04-03 ENCOUNTER — Other Ambulatory Visit: Payer: Self-pay

## 2019-04-03 ENCOUNTER — Telehealth: Payer: Self-pay | Admitting: Neurology

## 2019-04-03 ENCOUNTER — Encounter: Payer: Self-pay | Admitting: Cardiology

## 2019-04-03 VITALS — BP 142/77 | HR 56

## 2019-04-03 DIAGNOSIS — I10 Essential (primary) hypertension: Secondary | ICD-10-CM

## 2019-04-03 DIAGNOSIS — R55 Syncope and collapse: Secondary | ICD-10-CM

## 2019-04-03 DIAGNOSIS — R42 Dizziness and giddiness: Secondary | ICD-10-CM

## 2019-04-03 DIAGNOSIS — E782 Mixed hyperlipidemia: Secondary | ICD-10-CM | POA: Diagnosis not present

## 2019-04-03 NOTE — Telephone Encounter (Signed)
Please call patient, MRI brain showed enlargement of optic nerve sheet which is nonspecific, no acute abnormalities.   IMPRESSION:   MRI brain (without) demonstrating: - No acute or abnormal brain findings.  - Enlargement of optic nerve sheaths noted; this is nonspecific but can be seen in the setting of idiopathic intracranial hypertension.

## 2019-04-07 NOTE — Telephone Encounter (Signed)
I spoke to the patient and provided him with the results below.  He verbalized understanding.

## 2019-04-17 DIAGNOSIS — D696 Thrombocytopenia, unspecified: Secondary | ICD-10-CM | POA: Diagnosis not present

## 2019-04-17 DIAGNOSIS — N1831 Chronic kidney disease, stage 3a: Secondary | ICD-10-CM | POA: Diagnosis not present

## 2019-04-17 DIAGNOSIS — I6501 Occlusion and stenosis of right vertebral artery: Secondary | ICD-10-CM | POA: Diagnosis not present

## 2019-04-17 DIAGNOSIS — R55 Syncope and collapse: Secondary | ICD-10-CM | POA: Diagnosis not present

## 2019-04-17 DIAGNOSIS — I129 Hypertensive chronic kidney disease with stage 1 through stage 4 chronic kidney disease, or unspecified chronic kidney disease: Secondary | ICD-10-CM | POA: Diagnosis not present

## 2019-04-17 DIAGNOSIS — E669 Obesity, unspecified: Secondary | ICD-10-CM | POA: Diagnosis not present

## 2019-04-17 DIAGNOSIS — E785 Hyperlipidemia, unspecified: Secondary | ICD-10-CM | POA: Diagnosis not present

## 2019-06-12 ENCOUNTER — Ambulatory Visit: Payer: Medicare Other | Admitting: Cardiology

## 2019-06-12 NOTE — Progress Notes (Deleted)
Patient referred by [name], MD for presyncope  I connected with the patient on 04/05/2019 by a telephone call and verified that I am speaking with the correct person using two identifiers.     I offered the patient a video enabled application for a virtual visit. Unfortunately, this could not be accomplished due to technical difficulties/lack of video enabled phone/computer. I discussed the limitations of evaluation and management by telemedicine and the availability of in person appointments. The patient expressed understanding and agreed to proceed.   This visit type was conducted due to national recommendations for restrictions regarding the COVID-19 Pandemic (e.g. social distancing).  This format is felt to be most appropriate for this patient at this time.  All issues noted in this document were discussed and addressed.  No physical exam was performed (except for noted visual exam findings with Tele health visits).  The patient has consented to conduct a Tele health visit and understands insurance will be billed.    Subjective:   Steven Padilla, male    DOB: 1950-12-03, 68 y.o.   MRN: [id number]   No chief complaint on file.  HPI  68 y.o. Caucasian male with hypertension, hyperlipidemia, family h/o brain aneurysm, referred for evaluation of presyncope.  Patient has noticed that he has not had any episodes of presyncope in the recent times since weather has gotten colder.  He is walking every day without any symptoms.  Blood pressure is usually much lower than what it is today.  He is compliant with his medical therapy.  Work-up with echocardiogram and event monitor was unremarkable, details below.  Past Medical History:  Diagnosis Date  . Bone spur   . BPH with elevated PSA   . CKD (chronic kidney disease), stage III   . Detached retina, left   . Hyperlipemia   . Hypertension   . Syncope   . Vision disturbance      Past Surgical History:  Procedure Laterality  Date  . CHOLECYSTECTOMY    . lt knee    . rt shoulder       Social History   Socioeconomic History  . Marital status: Married    Spouse name: Not on file  . Number of children: 2  . Years of education: some college  . Highest education level: Not on file  Occupational History  . Occupation: owns apple orchard  . Occupation: retired Arts administrator  Tobacco Use  . Smoking status: Former Smoker    Packs/day: 2.00    Years: 20.00    Pack years: 40.00    Quit date: 1983    Years since quitting: 37.9  . Smokeless tobacco: Former Network engineer and Sexual Activity  . Alcohol use: No  . Drug use: Not Currently  . Sexual activity: Not on file  Other Topics Concern  . Not on file  Social History Narrative   Lives at home with his wife.   Ambidextrous.   Caffeine use:  One cup per day.   Social Determinants of Health   Financial Resource Strain:   . Difficulty of Paying Living Expenses: Not on file  Food Insecurity:   . Worried About Charity fundraiser in the Last Year: Not on file  . Ran Out of Food in the Last Year: Not on file  Transportation Needs:   . Lack of Transportation (Medical): Not on file  . Lack of Transportation (Non-Medical): Not on file  Physical Activity:   . Days  of Exercise per Week: Not on file  . Minutes of Exercise per Session: Not on file  Stress:   . Feeling of Stress : Not on file  Social Connections:   . Frequency of Communication with Friends and Family: Not on file  . Frequency of Social Gatherings with Friends and Family: Not on file  . Attends Religious Services: Not on file  . Active Member of Clubs or Organizations: Not on file  . Attends Archivist Meetings: Not on file  . Marital Status: Not on file  Intimate Partner Violence:   . Fear of Current or Ex-Partner: Not on file  . Emotionally Abused: Not on file  . Physically Abused: Not on file  . Sexually Abused: Not on file     Family History  Problem Relation Age of  Onset  . Hypertension Mother   . Hypertension Father   . Hypertension Brother   . Aneurysm Brother      Current Outpatient Medications on File Prior to Visit  Medication Sig Dispense Refill  . aspirin EC 81 MG tablet Take 1 tablet by mouth daily.    Marland Kitchen atorvastatin (LIPITOR) 10 MG tablet Take 1 tablet (10 mg total) by mouth daily. 30 tablet 3  . fluticasone (FLONASE) 50 MCG/ACT nasal spray Place into the nose.    . irbesartan (AVAPRO) 300 MG tablet Take 1 tablet by mouth daily.    . traMADol (ULTRAM) 50 MG tablet Take 50 mg by mouth every 8 (eight) hours as needed for pain.     No current facility-administered medications on file prior to visit.    Cardiovascular studies:  Event monitor 02/21/2019 - 03/22/2019: Dominant rhythm sinus. HR 38-120 bpm. One episode of skipped beats correlated with normal sinus rhythm. No arrhythmia noted.   Echocardiogram 03/12/2019: Left ventricle cavity is normal in size. Mild concentric hypertrophy of the left ventricle. Normal LV systolic function with EF 68%. Normal global wall motion. Doppler evidence of grade I (impaired) diastolic dysfunction, normal LAP.  Left atrial cavity is mildly dilated. Aneurysmal interatrial septum without 2D or color Doppler evidence of interatrial shunt. Trileaflet aortic valve. Mild aortic valve leaflet calcification. Mild (Grade I) aortic regurgitation. Mild pulmonic regurgitation. No evidence of pulmonary hypertension.  EKG 02/21/2019: Sinus bradycardia 50 bpm. Borderline first degree AV block.  CTA head/neck 02/19/2019: Normal appearance of the brain itself. Aortic atherosclerosis. Atherosclerotic disease at both carotid bifurcation and ICA bulb regions but without stenosis. Atherosclerotic plaque of both common carotid arteries, 40% stenosis on the right. Less than 20% stenosis on the left. Atherosclerotic disease in both carotid siphon regions. 40% stenosis of the right supraclinoid ICA. No stenosis on the  left greater than 20%. No stenosis or occlusion of the anterior circulation branch vessels. 50-70% stenosis of the right vertebral artery origin but wide patency beyond that to the basilar. Left vertebral artery arises from the arch, without stenosis and is patent to the basilar. I do not see vascular disease of the degree that I would expect to result in syncopal episodes.  Recent labs: 11/15/2018: Glucose 105. BUN/Cr 27/1.5. eGFR 56. Na/K 142/4.8. H/H 13.8/95. MCV 95. Platelets 125.  Chol 204, TG 67, HDL 43, LDL 148   Review of Systems  Constitution: Negative for decreased appetite, malaise/fatigue, weight gain and weight loss.  HENT: Negative for congestion.   Eyes: Negative for visual disturbance.  Cardiovascular: Negative for chest pain, dyspnea on exertion, leg swelling, palpitations and syncope.  Respiratory: Negative for cough.  Endocrine: Negative for cold intolerance.  Hematologic/Lymphatic: Does not bruise/bleed easily.  Skin: Negative for itching and rash.  Musculoskeletal: Negative for myalgias.  Gastrointestinal: Negative for abdominal pain, nausea and vomiting.  Genitourinary: Positive for frequency. Negative for dysuria.  Neurological: Positive for dizziness and light-headedness. Negative for weakness.  Psychiatric/Behavioral: The patient is not nervous/anxious.   All other systems reviewed and are negative.        There were no vitals filed for this visit.   Objective:   Physical ExamNot performed. Telephone note.        Assessment & Recommendations:   68 y.o. Caucasian male with hypertension, hyperlipidemia, family h/o brain aneurysm, referred for evaluation of presyncope.  Presyncope: Reassuring echocardiogram and event monitor. Low suspicion for angina at this time. Continue management for hypertension.   Hyperlipidemia: Tolerating Lipitor 10 mg daily. Repeat lipid panel in 3 months.   Nigel Mormon, MD [facility]  Pager: [phone] Office: [phone] If no answer Cell [phone]

## 2019-06-13 LAB — LIPID PANEL
Chol/HDL Ratio: 3.5 ratio (ref 0.0–5.0)
Cholesterol, Total: 151 mg/dL (ref 100–199)
HDL: 43 mg/dL (ref >39)
LDL Chol Calc (NIH): 91 mg/dL (ref 0–99)
Triglycerides: 87 mg/dL (ref 0–149)
VLDL Cholesterol Cal: 17 mg/dL (ref 5–40)

## 2019-06-17 ENCOUNTER — Telehealth (INDEPENDENT_AMBULATORY_CARE_PROVIDER_SITE_OTHER): Payer: Medicare Other | Admitting: Cardiology

## 2019-06-17 ENCOUNTER — Other Ambulatory Visit: Payer: Self-pay

## 2019-06-17 ENCOUNTER — Encounter: Payer: Self-pay | Admitting: Cardiology

## 2019-06-17 VITALS — BP 120/70 | HR 65

## 2019-06-17 DIAGNOSIS — M791 Myalgia, unspecified site: Secondary | ICD-10-CM

## 2019-06-17 DIAGNOSIS — M2550 Pain in unspecified joint: Secondary | ICD-10-CM

## 2019-06-17 DIAGNOSIS — I10 Essential (primary) hypertension: Secondary | ICD-10-CM | POA: Diagnosis not present

## 2019-06-17 DIAGNOSIS — E782 Mixed hyperlipidemia: Secondary | ICD-10-CM | POA: Diagnosis not present

## 2019-06-17 NOTE — Progress Notes (Signed)
Patient referred by [name], MD for presyncope  I connected with the patient on 04/05/2019 by a telephone call and verified that I am speaking with the correct person using two identifiers.     I offered the patient a video enabled application for a virtual visit. Unfortunately, this could not be accomplished due to technical difficulties/lack of video enabled phone/computer. I discussed the limitations of evaluation and management by telemedicine and the availability of in person appointments. The patient expressed understanding and agreed to proceed.   This visit type was conducted due to national recommendations for restrictions regarding the COVID-19 Pandemic (e.g. social distancing).  This format is felt to be most appropriate for this patient at this time.  All issues noted in this document were discussed and addressed.  No physical exam was performed (except for noted visual exam findings with Tele health visits).  The patient has consented to conduct a Tele health visit and understands insurance will be billed.    Subjective:   Steven Padilla, male    DOB: Nov 13, 1950, 68 y.o.   MRN: [id number]   Chief Complaint  Patient presents with  . Hyperlipidemia   HPI  68 y.o. Caucasian male with hypertension, hyperlipidemia, family h/o brain aneurysm, h/o presyncope.  He has not had any recurrent presyncopal symptoms. Lipid panel reviewed the patient. He has had neck and other joint pains since being on Lipitor.   Past Medical History:  Diagnosis Date  . Bone spur   . BPH with elevated PSA   . CKD (chronic kidney disease), stage III   . Detached retina, left   . Hyperlipemia   . Hypertension   . Syncope   . Vision disturbance      Past Surgical History:  Procedure Laterality Date  . CHOLECYSTECTOMY    . lt knee    . rt shoulder       Social History   Socioeconomic History  . Marital status: Married    Spouse name: Not on file  . Number of children: 2  .  Years of education: some college  . Highest education level: Not on file  Occupational History  . Occupation: owns apple orchard  . Occupation: retired Arts administrator  Tobacco Use  . Smoking status: Former Smoker    Packs/day: 2.00    Years: 20.00    Pack years: 40.00    Quit date: 1983    Years since quitting: 38.0  . Smokeless tobacco: Former Network engineer and Sexual Activity  . Alcohol use: No  . Drug use: Not Currently  . Sexual activity: Not on file  Other Topics Concern  . Not on file  Social History Narrative   Lives at home with his wife.   Ambidextrous.   Caffeine use:  One cup per day.   Social Determinants of Health   Financial Resource Strain:   . Difficulty of Paying Living Expenses: Not on file  Food Insecurity:   . Worried About Charity fundraiser in the Last Year: Not on file  . Ran Out of Food in the Last Year: Not on file  Transportation Needs:   . Lack of Transportation (Medical): Not on file  . Lack of Transportation (Non-Medical): Not on file  Physical Activity:   . Days of Exercise per Week: Not on file  . Minutes of Exercise per Session: Not on file  Stress:   . Feeling of Stress : Not on file  Social Connections:   .  Frequency of Communication with Friends and Family: Not on file  . Frequency of Social Gatherings with Friends and Family: Not on file  . Attends Religious Services: Not on file  . Active Member of Clubs or Organizations: Not on file  . Attends Archivist Meetings: Not on file  . Marital Status: Not on file  Intimate Partner Violence:   . Fear of Current or Ex-Partner: Not on file  . Emotionally Abused: Not on file  . Physically Abused: Not on file  . Sexually Abused: Not on file     Family History  Problem Relation Age of Onset  . Hypertension Mother   . Hypertension Father   . Hypertension Brother   . Aneurysm Brother      Current Outpatient Medications on File Prior to Visit  Medication Sig Dispense  Refill  . aspirin EC 81 MG tablet Take 1 tablet by mouth daily.    Marland Kitchen atorvastatin (LIPITOR) 10 MG tablet Take 1 tablet (10 mg total) by mouth daily. 30 tablet 3  . fluticasone (FLONASE) 50 MCG/ACT nasal spray Place into the nose.    . irbesartan (AVAPRO) 300 MG tablet Take 1 tablet by mouth daily.    . traMADol (ULTRAM) 50 MG tablet Take 50 mg by mouth every 8 (eight) hours as needed for pain.     No current facility-administered medications on file prior to visit.    Cardiovascular studies:  Event monitor 02/21/2019 - 03/22/2019: Dominant rhythm sinus. HR 38-120 bpm. One episode of skipped beats correlated with normal sinus rhythm. No arrhythmia noted.   Echocardiogram 03/12/2019: Left ventricle cavity is normal in size. Mild concentric hypertrophy of the left ventricle. Normal LV systolic function with EF 68%. Normal global wall motion. Doppler evidence of grade I (impaired) diastolic dysfunction, normal LAP.  Left atrial cavity is mildly dilated. Aneurysmal interatrial septum without 2D or color Doppler evidence of interatrial shunt. Trileaflet aortic valve. Mild aortic valve leaflet calcification. Mild (Grade I) aortic regurgitation. Mild pulmonic regurgitation. No evidence of pulmonary hypertension.  EKG 02/21/2019: Sinus bradycardia 50 bpm. Borderline first degree AV block.  CTA head/neck 02/19/2019: Normal appearance of the brain itself. Aortic atherosclerosis. Atherosclerotic disease at both carotid bifurcation and ICA bulb regions but without stenosis. Atherosclerotic plaque of both common carotid arteries, 40% stenosis on the right. Less than 20% stenosis on the left. Atherosclerotic disease in both carotid siphon regions. 40% stenosis of the right supraclinoid ICA. No stenosis on the left greater than 20%. No stenosis or occlusion of the anterior circulation branch vessels. 50-70% stenosis of the right vertebral artery origin but wide patency beyond that to the  basilar. Left vertebral artery arises from the arch, without stenosis and is patent to the basilar. I do not see vascular disease of the degree that I would expect to result in syncopal episodes.  Recent labs: 06/12/2019: Chol 151, TG 87, HDL 43, LDL 91  11/15/2018: Glucose 105. BUN/Cr 27/1.5. eGFR 56. Na/K 142/4.8. H/H 13.8/95. MCV 95. Platelets 125.  Chol 204, TG 67, HDL 43, LDL 148   Review of Systems  Constitution: Negative for decreased appetite, malaise/fatigue, weight gain and weight loss.  HENT: Negative for congestion.   Eyes: Negative for visual disturbance.  Cardiovascular: Negative for chest pain, dyspnea on exertion, leg swelling, palpitations and syncope.  Respiratory: Negative for cough.   Endocrine: Negative for cold intolerance.  Hematologic/Lymphatic: Does not bruise/bleed easily.  Skin: Negative for itching and rash.  Musculoskeletal: Negative for myalgias.  Gastrointestinal:  Negative for abdominal pain, nausea and vomiting.  Genitourinary: Positive for frequency. Negative for dysuria.  Neurological: Positive for dizziness and light-headedness. Negative for weakness.  Psychiatric/Behavioral: The patient is not nervous/anxious.   All other systems reviewed and are negative.        Vitals:   06/17/19 1459  BP: 120/70  Pulse: 65     Objective:   Physical ExamNot performed. Telephone note.        Assessment & Recommendations:   68 y.o. Caucasian male with hypertension, hyperlipidemia, family h/o brain aneurysm, h/o presyncope.  Presyncope: No recurrence  Hyperlipidemia: LDL down from 148 t0 91. He has neck and other joint pains. Not convinced these are lipitor side effects, but reasonable to try drug holiday for 2-3 weeks. Check CK. F/u w/me in 3-4 weeks to reassess.  Nigel Mormon, MD [facility] Pager: [phone] Office: [phone] If no answer Cell [phone]

## 2019-07-09 LAB — CK: Total CK: 132 U/L (ref 41–331)

## 2019-07-17 ENCOUNTER — Encounter: Payer: Self-pay | Admitting: Cardiology

## 2019-07-17 ENCOUNTER — Other Ambulatory Visit: Payer: Self-pay

## 2019-07-17 ENCOUNTER — Ambulatory Visit (INDEPENDENT_AMBULATORY_CARE_PROVIDER_SITE_OTHER): Payer: Medicare Other | Admitting: Cardiology

## 2019-07-17 VITALS — BP 133/78 | HR 59 | Temp 97.2°F | Ht 76.0 in | Wt 260.7 lb

## 2019-07-17 DIAGNOSIS — I10 Essential (primary) hypertension: Secondary | ICD-10-CM

## 2019-07-17 DIAGNOSIS — Z789 Other specified health status: Secondary | ICD-10-CM

## 2019-07-17 DIAGNOSIS — E782 Mixed hyperlipidemia: Secondary | ICD-10-CM | POA: Diagnosis not present

## 2019-07-17 MED ORDER — EZETIMIBE 10 MG PO TABS
10.0000 mg | ORAL_TABLET | Freq: Every day | ORAL | 3 refills | Status: DC
Start: 2019-07-17 — End: 2019-10-13

## 2019-07-17 NOTE — Progress Notes (Signed)
Patient referred by [name], MD for presyncope  Subjective:   Steven Padilla, male    DOB: March 16, 1951, 69 y.o.   MRN: [id number]   Chief Complaint  Patient presents with  . Hypertension  . Hyperlipidemia  . Follow-up    pain in joints   HPI  68 y.o. Caucasian male with hypertension, hyperlipidemia, family h/o brain aneurysm, h/o presyncope.  At last visit, patient had reported joint/muscle pain since starting Lipitor.  I suggested be hold Lipitor for 2 weeks to see if symptoms improve.  His CK was normal.  Symptoms have improved since stopping Lipitor. He has had similar symptoms with other statins as well.    Current Outpatient Medications on File Prior to Visit  Medication Sig Dispense Refill  . aspirin EC 81 MG tablet Take 1 tablet by mouth daily.    Marland Kitchen atorvastatin (LIPITOR) 10 MG tablet Take 1 tablet (10 mg total) by mouth daily. 30 tablet 3  . fluticasone (FLONASE) 50 MCG/ACT nasal spray Place into the nose.    . irbesartan (AVAPRO) 300 MG tablet Take 1 tablet by mouth daily.    . traMADol (ULTRAM) 50 MG tablet Take 50 mg by mouth every 8 (eight) hours as needed for pain.     No current facility-administered medications on file prior to visit.    Cardiovascular studies:  EKG 07/17/2019: Sinus rhythm 60 bpm. Left anterior fascicular block. Low voltage.   Event monitor 02/21/2019 - 03/22/2019: Dominant rhythm sinus. HR 38-120 bpm. One episode of skipped beats correlated with normal sinus rhythm. No arrhythmia noted.   Echocardiogram 03/12/2019: Left ventricle cavity is normal in size. Mild concentric hypertrophy of the left ventricle. Normal LV systolic function with EF 68%. Normal global wall motion. Doppler evidence of grade I (impaired) diastolic dysfunction, normal LAP.  Left atrial cavity is mildly dilated. Aneurysmal interatrial septum without 2D or color Doppler evidence of interatrial shunt. Trileaflet aortic valve. Mild aortic valve leaflet  calcification. Mild (Grade I) aortic regurgitation. Mild pulmonic regurgitation. No evidence  CTA head/neck 02/19/2019: Normal appearance of the brain itself. Aortic atherosclerosis. Atherosclerotic disease at both carotid bifurcation and ICA bulb regions but without stenosis. Atherosclerotic plaque of both common carotid arteries, 40% stenosis on the right. Less than 20% stenosis on the left. Atherosclerotic disease in both carotid siphon regions. 40% stenosis of the right supraclinoid ICA. No stenosis on the left greater than 20%. No stenosis or occlusion of the anterior circulation branch vessels. 50-70% stenosis of the right vertebral artery origin but wide patency beyond that to the basilar. Left vertebral artery arises from the arch, without stenosis and is patent to the basilar. I do not see vascular disease of the degree that I would expect to result in syncopal episodes.  Recent labs: 06/12/2019: Chol 151, TG 87, HDL 43, LDL 91  11/15/2018: Glucose 105. BUN/Cr 27/1.5. eGFR 56. Na/K 142/4.8. H/H 13.8/95. MCV 95. Platelets 125.  Chol 204, TG 67, HDL 43, LDL 148   Review of Systems  Cardiovascular: Negative for chest pain, dyspnea on exertion, leg swelling, palpitations and syncope.  Musculoskeletal: Positive for joint pain.        Vitals:   07/17/19 1455  BP: 133/78  Pulse: (!) 59  Temp: (!) 97.2 F (36.2 C)  SpO2: 97%     Objective:   Physical Exam  Constitutional: He appears well-developed and well-nourished.  Neck: No JVD present.  Cardiovascular: Normal rate, regular rhythm, normal heart sounds and intact distal pulses.  No murmur heard. Pulmonary/Chest: Effort normal and breath sounds normal. He has no wheezes. He has no rales.  Musculoskeletal:        General: No edema.  Nursing note and vitals reviewed.        Assessment & Recommendations:   69 y.o. Caucasian male with hypertension, hyperlipidemia, family h/o brain aneurysm, h/o  presyncope.  Presyncope: No recurrence  Hyperlipidemia: LDL down from 148 to 91 on Lipitor. However, he has had myalgias with Lipitor, and other statins. He would like to try non-statin oral medications, before trying PCSK9 inhibitors. Will Try Zetia 10 mg daily.  Repeat lipid panel and f/u in 3 months.   Nigel Mormon, MD [facility] Pager: [phone] Office: [phone] If no answer Cell [phone]

## 2019-08-21 DIAGNOSIS — N1831 Chronic kidney disease, stage 3a: Secondary | ICD-10-CM | POA: Diagnosis not present

## 2019-08-21 DIAGNOSIS — I6501 Occlusion and stenosis of right vertebral artery: Secondary | ICD-10-CM | POA: Diagnosis not present

## 2019-08-21 DIAGNOSIS — I129 Hypertensive chronic kidney disease with stage 1 through stage 4 chronic kidney disease, or unspecified chronic kidney disease: Secondary | ICD-10-CM | POA: Diagnosis not present

## 2019-08-21 DIAGNOSIS — E785 Hyperlipidemia, unspecified: Secondary | ICD-10-CM | POA: Diagnosis not present

## 2019-08-21 DIAGNOSIS — E669 Obesity, unspecified: Secondary | ICD-10-CM | POA: Diagnosis not present

## 2019-08-21 DIAGNOSIS — D696 Thrombocytopenia, unspecified: Secondary | ICD-10-CM | POA: Diagnosis not present

## 2019-10-02 DIAGNOSIS — R972 Elevated prostate specific antigen [PSA]: Secondary | ICD-10-CM | POA: Diagnosis not present

## 2019-10-08 DIAGNOSIS — N401 Enlarged prostate with lower urinary tract symptoms: Secondary | ICD-10-CM | POA: Diagnosis not present

## 2019-10-08 DIAGNOSIS — R3915 Urgency of urination: Secondary | ICD-10-CM | POA: Diagnosis not present

## 2019-10-09 ENCOUNTER — Other Ambulatory Visit (HOSPITAL_COMMUNITY): Payer: Self-pay | Admitting: Cardiology

## 2019-10-09 DIAGNOSIS — Z789 Other specified health status: Secondary | ICD-10-CM | POA: Diagnosis not present

## 2019-10-09 DIAGNOSIS — E782 Mixed hyperlipidemia: Secondary | ICD-10-CM | POA: Diagnosis not present

## 2019-10-10 LAB — LIPID PANEL
Chol/HDL Ratio: 3.4 ratio (ref 0.0–5.0)
Cholesterol, Total: 147 mg/dL (ref 100–199)
HDL: 43 mg/dL (ref >39)
LDL Chol Calc (NIH): 90 mg/dL (ref 0–99)
Triglycerides: 69 mg/dL (ref 0–149)
VLDL Cholesterol Cal: 14 mg/dL (ref 5–40)

## 2019-10-13 ENCOUNTER — Encounter: Payer: Self-pay | Admitting: Cardiology

## 2019-10-13 ENCOUNTER — Ambulatory Visit: Payer: Medicare Other | Admitting: Cardiology

## 2019-10-13 ENCOUNTER — Other Ambulatory Visit: Payer: Self-pay

## 2019-10-13 VITALS — BP 146/80 | HR 67 | Temp 96.6°F | Resp 18 | Ht 76.0 in | Wt 261.0 lb

## 2019-10-13 DIAGNOSIS — Z789 Other specified health status: Secondary | ICD-10-CM

## 2019-10-13 DIAGNOSIS — I10 Essential (primary) hypertension: Secondary | ICD-10-CM | POA: Diagnosis not present

## 2019-10-13 DIAGNOSIS — E782 Mixed hyperlipidemia: Secondary | ICD-10-CM

## 2019-10-13 MED ORDER — ATORVASTATIN CALCIUM 10 MG PO TABS: 10.0000 mg | ORAL_TABLET | Freq: Every day | ORAL | 3 refills | Status: DC

## 2019-10-13 MED ORDER — EZETIMIBE 10 MG PO TABS
10.0000 mg | ORAL_TABLET | Freq: Every day | ORAL | 5 refills | Status: DC
Start: 2019-10-13 — End: 2020-04-05

## 2019-10-13 NOTE — Progress Notes (Signed)
Patient referred by [name], MD for presyncope  Subjective:   Steven Padilla, male    DOB: Nov 17, 1950, 69 y.o.   MRN: [id number]   Chief Complaint  Patient presents with  . Hypertension  . Follow-up    3 month  . Results    lab   HPI  69 y.o. Caucasian male with hypertension, hyperlipidemia, family h/o brain aneurysm, h/o presyncope.  Patient is doing well. He denies chest pain, shortness of breath, palpitations, leg edema, orthopnea, PND, TIA/syncope. He has had episodes of neck pain and irritation in both eyes. He wonders if these symptoms could be related to his intracranial atherosclerosis.  He is currently taking Lipitor 10 mg daily. He is not taking Zetia. Reviewed recent lipid panel with the patient.   Current Outpatient Medications on File Prior to Visit  Medication Sig Dispense Refill  . aspirin EC 81 MG tablet Take 1 tablet by mouth daily.    Marland Kitchen atorvastatin (LIPITOR) 10 MG tablet Take 10 mg by mouth daily.    . fluticasone (FLONASE) 50 MCG/ACT nasal spray Place into the nose.    . irbesartan (AVAPRO) 300 MG tablet Take 1 tablet by mouth daily.    . tamsulosin (FLOMAX) 0.4 MG CAPS capsule Take 0.4 mg by mouth daily after breakfast.    . traMADol (ULTRAM) 50 MG tablet Take 50 mg by mouth every 8 (eight) hours as needed for pain.     No current facility-administered medications on file prior to visit.    Cardiovascular studies:  EKG 07/17/2019: Sinus rhythm 60 bpm. Left anterior fascicular block. Low voltage.   Event monitor 02/21/2019 - 03/22/2019: Dominant rhythm sinus. HR 38-120 bpm. One episode of skipped beats correlated with normal sinus rhythm. No arrhythmia noted.   Echocardiogram 03/12/2019: Left ventricle cavity is normal in size. Mild concentric hypertrophy of the left ventricle. Normal LV systolic function with EF 68%. Normal global wall motion. Doppler evidence of grade I (impaired) diastolic dysfunction, normal LAP.  Left atrial cavity is  mildly dilated. Aneurysmal interatrial septum without 2D or color Doppler evidence of interatrial shunt. Trileaflet aortic valve. Mild aortic valve leaflet calcification. Mild (Grade I) aortic regurgitation. Mild pulmonic regurgitation. No evidence  CTA head/neck 02/19/2019: Normal appearance of the brain itself. Aortic atherosclerosis. Atherosclerotic disease at both carotid bifurcation and ICA bulb regions but without stenosis. Atherosclerotic plaque of both common carotid arteries, 40% stenosis on the right. Less than 20% stenosis on the left. Atherosclerotic disease in both carotid siphon regions. 40% stenosis of the right supraclinoid ICA. No stenosis on the left greater than 20%. No stenosis or occlusion of the anterior circulation branch vessels. 50-70% stenosis of the right vertebral artery origin but wide patency beyond that to the basilar. Left vertebral artery arises from the arch, without stenosis and is patent to the basilar. I do not see vascular disease of the degree that I would expect to result in syncopal episodes.  Recent labs: 10/09/2019: Chol 147, TG 69, HDL 43, LDL 90  06/12/2019: Chol 151, TG 87, HDL 43, LDL 91  11/15/2018: Glucose 105. BUN/Cr 27/1.5. eGFR 56. Na/K 142/4.8. H/H 13.8/95. MCV 95. Platelets 125.  Chol 204, TG 67, HDL 43, LDL 148   Review of Systems  Cardiovascular: Negative for chest pain, dyspnea on exertion, leg swelling, palpitations and syncope.  Musculoskeletal: Positive for joint pain.        Vitals:   10/13/19 1502 10/13/19 1505  BP: (!) 170/84 (!) 146/80  Pulse:  66 67  Resp: 18   Temp: (!) 96.6 F (35.9 C)   SpO2: 99% 99%     Objective:   Physical Exam  Constitutional: He appears well-developed and well-nourished.  Neck: No JVD present.  Cardiovascular: Normal rate, regular rhythm, normal heart sounds and intact distal pulses.  No murmur heard. Pulmonary/Chest: Effort normal and breath sounds normal. He has no  wheezes. He has no rales.  Musculoskeletal:        General: No edema.  Nursing note and vitals reviewed.        Assessment & Recommendations:   68 y.o. Caucasian male with hypertension, hyperlipidemia, family h/o brain aneurysm, h/o presyncope.  Presyncope: No recurrence  Hyperlipidemia: LDL down from 148 to 91 on Lipitor. Given that he has had intolerance to higher doses, will add Zetia 10 mg daily.  His neck pain is likely musculoskeletal. His eye irritation is likely allergic in origin. I do not think these symptoms are related to intracranial atheroslcerosis.   Repeat lipid panel in 6 months. F/u after that. Continue f/u w/PCP re: hypertension, BPH management.    Manish J Patwardhan, MD [facility] Pager: [phone] Office: [phone] If no answer Cell [phone]   

## 2019-11-18 DIAGNOSIS — E7849 Other hyperlipidemia: Secondary | ICD-10-CM | POA: Diagnosis not present

## 2019-11-18 DIAGNOSIS — Z125 Encounter for screening for malignant neoplasm of prostate: Secondary | ICD-10-CM | POA: Diagnosis not present

## 2019-11-26 DIAGNOSIS — Z1331 Encounter for screening for depression: Secondary | ICD-10-CM | POA: Diagnosis not present

## 2019-11-26 DIAGNOSIS — R55 Syncope and collapse: Secondary | ICD-10-CM | POA: Diagnosis not present

## 2019-11-26 DIAGNOSIS — M545 Low back pain: Secondary | ICD-10-CM | POA: Diagnosis not present

## 2019-11-26 DIAGNOSIS — E669 Obesity, unspecified: Secondary | ICD-10-CM | POA: Diagnosis not present

## 2019-11-26 DIAGNOSIS — R82998 Other abnormal findings in urine: Secondary | ICD-10-CM | POA: Diagnosis not present

## 2019-11-26 DIAGNOSIS — E7849 Other hyperlipidemia: Secondary | ICD-10-CM | POA: Diagnosis not present

## 2019-11-26 DIAGNOSIS — I129 Hypertensive chronic kidney disease with stage 1 through stage 4 chronic kidney disease, or unspecified chronic kidney disease: Secondary | ICD-10-CM | POA: Diagnosis not present

## 2019-11-26 DIAGNOSIS — M1990 Unspecified osteoarthritis, unspecified site: Secondary | ICD-10-CM | POA: Diagnosis not present

## 2019-11-26 DIAGNOSIS — I6501 Occlusion and stenosis of right vertebral artery: Secondary | ICD-10-CM | POA: Diagnosis not present

## 2019-11-26 DIAGNOSIS — N1831 Chronic kidney disease, stage 3a: Secondary | ICD-10-CM | POA: Diagnosis not present

## 2019-11-26 DIAGNOSIS — N400 Enlarged prostate without lower urinary tract symptoms: Secondary | ICD-10-CM | POA: Diagnosis not present

## 2019-11-26 DIAGNOSIS — D696 Thrombocytopenia, unspecified: Secondary | ICD-10-CM | POA: Diagnosis not present

## 2019-11-26 DIAGNOSIS — Z0000 Encounter for general adult medical examination without abnormal findings: Secondary | ICD-10-CM | POA: Diagnosis not present

## 2020-04-05 ENCOUNTER — Other Ambulatory Visit: Payer: Self-pay | Admitting: Cardiology

## 2020-04-05 DIAGNOSIS — E782 Mixed hyperlipidemia: Secondary | ICD-10-CM

## 2020-04-08 ENCOUNTER — Other Ambulatory Visit (HOSPITAL_COMMUNITY): Payer: Self-pay | Admitting: Cardiology

## 2020-04-08 DIAGNOSIS — E782 Mixed hyperlipidemia: Secondary | ICD-10-CM | POA: Diagnosis not present

## 2020-04-09 LAB — LIPID PANEL
Chol/HDL Ratio: 3.2 ratio (ref 0.0–5.0)
Cholesterol, Total: 136 mg/dL (ref 100–199)
HDL: 43 mg/dL (ref >39)
LDL Chol Calc (NIH): 78 mg/dL (ref 0–99)
Triglycerides: 76 mg/dL (ref 0–149)
VLDL Cholesterol Cal: 15 mg/dL (ref 5–40)

## 2020-04-09 NOTE — Progress Notes (Signed)
Patient referred by [name], MD for presyncope  Subjective:   Steven Padilla, male    DOB: September 12, 1950, 69 y.o.   MRN: [id number]   Chief Complaint  Patient presents with  . Hypertension  . Hyperlipidemia  . Follow-up    71 month   HPI  69 y.o. Caucasian male with hypertension, hyperlipidemia, Rt vertebral artery stenosis, family h/o brain aneurysm, h/o presyncope.  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  He manually picks apples from the trees, walks a lot throughout the day.  He denies any chest pain, shortness of breath symptoms.  However, he does note episodes of dizziness, typically when he looks up to clock apples from the trees.  Sometimes, he also experiences dizziness when he stands up from sitting position.  He does have chronic neck pain.  He was found to have a moderate right vertebral artery stenosis on CT angiogram in 01/2019.  He has been intolerant to higher doses of statin.  Therefore, he is on Lipitor 10 mg and Zetia 10 mg daily.  Recent lipid panel reviewed with the patient, details below.   Current Outpatient Medications on File Prior to Visit  Medication Sig Dispense Refill  . aspirin EC 81 MG tablet Take 1 tablet by mouth daily.    Marland Kitchen atorvastatin (LIPITOR) 10 MG tablet Take 1 tablet (10 mg total) by mouth daily. 90 tablet 3  . ezetimibe (ZETIA) 10 MG tablet TAKE 1 TABLET DAILY 30 tablet 0  . fluticasone (FLONASE) 50 MCG/ACT nasal spray Place into the nose.    . irbesartan (AVAPRO) 300 MG tablet Take 1 tablet by mouth daily.    . tamsulosin (FLOMAX) 0.4 MG CAPS capsule Take 0.4 mg by mouth daily after breakfast.    . traMADol (ULTRAM) 50 MG tablet Take 50 mg by mouth every 8 (eight) hours as needed for pain.     No current facility-administered medications on file prior to visit.    Cardiovascular studies:  EKG 04/12/2020: Sinus rhythm 54 bpm Lw voltage   Event monitor 02/21/2019 - 03/22/2019: Dominant rhythm  sinus. HR 38-120 bpm. One episode of skipped beats correlated with normal sinus rhythm. No arrhythmia noted.   Echocardiogram 03/12/2019: Left ventricle cavity is normal in size. Mild concentric hypertrophy of the left ventricle. Normal LV systolic function with EF 68%. Normal global wall motion. Doppler evidence of grade I (impaired) diastolic dysfunction, normal LAP.  Left atrial cavity is mildly dilated. Aneurysmal interatrial septum without 2D or color Doppler evidence of interatrial shunt. Trileaflet aortic valve. Mild aortic valve leaflet calcification. Mild (Grade I) aortic regurgitation. Mild pulmonic regurgitation. No evidence  CTA head/neck 02/19/2019: Normal appearance of the brain itself. Aortic atherosclerosis. Atherosclerotic disease at both carotid bifurcation and ICA bulb regions but without stenosis. Atherosclerotic plaque of both common carotid arteries, 40% stenosis on the right. Less than 20% stenosis on the left. Atherosclerotic disease in both carotid siphon regions. 40% stenosis of the right supraclinoid ICA. No stenosis on the left greater than 20%. No stenosis or occlusion of the anterior circulation branch vessels. 50-70% stenosis of the right vertebral artery origin but wide patency beyond that to the basilar. Left vertebral artery arises from the arch, without stenosis and is patent to the basilar. I do not see vascular disease of the degree that I would expect to result in syncopal episodes.  Recent labs: 04/08/2020: Chol 136, TG 76, HDL 43, LDL 78  10/09/2019: Chol 147,  TG 69, HDL 43, LDL 90  06/12/2019: Chol 151, TG 87, HDL 43, LDL 91  11/15/2018: Glucose 105. BUN/Cr 27/1.5. eGFR 56. Na/K 142/4.8. H/H 13.8/95. MCV 95. Platelets 125.  Chol 204, TG 67, HDL 43, LDL 148   Review of Systems  Cardiovascular: Negative for chest pain, dyspnea on exertion, leg swelling, palpitations and syncope.  Musculoskeletal: Positive for joint pain.         Vitals:   04/12/20 0844  BP: (!) 145/76  Pulse: (!) 57  Resp: 16  SpO2: 96%     Objective:   Physical Exam Vitals and nursing note reviewed.  Constitutional:      Appearance: He is well-developed.  Neck:     Vascular: No JVD.  Cardiovascular:     Rate and Rhythm: Normal rate and regular rhythm.     Pulses: Intact distal pulses.     Heart sounds: Normal heart sounds. No murmur heard.   Pulmonary:     Effort: Pulmonary effort is normal.     Breath sounds: Normal breath sounds. No wheezing or rales.          Assessment & Recommendations:   69 y.o. Caucasian male with hypertension, hyperlipidemia, family h/o brain aneurysm, h/o presyncope.  Dizziness: Differentials include postural dizziness, BPH, cervicalgia, or vertebrobasilar insufficiency. He has moderate right vertebral artery stenosis on CTA in 01/2019. Will refer to Neurology.  Continue Aspirin, lipitor, Zetia.   Hyperlipidemia: LDL down from 148 to 80 on Lipitor 10 mg and Zetia 10 mg. He is intolerant to higher doses of statin.  F/u in 1 year after repeat lipid panel.   Nigel Mormon, MD [facility] Pager: [phone] Office: [phone] If no answer Cell [phone]

## 2020-04-12 ENCOUNTER — Other Ambulatory Visit: Payer: Self-pay

## 2020-04-12 ENCOUNTER — Ambulatory Visit: Payer: Medicare Other | Admitting: Cardiology

## 2020-04-12 ENCOUNTER — Encounter: Payer: Self-pay | Admitting: Cardiology

## 2020-04-12 VITALS — BP 145/76 | HR 57 | Resp 16 | Ht 76.0 in | Wt 265.0 lb

## 2020-04-12 DIAGNOSIS — I6501 Occlusion and stenosis of right vertebral artery: Secondary | ICD-10-CM

## 2020-04-12 DIAGNOSIS — R42 Dizziness and giddiness: Secondary | ICD-10-CM

## 2020-04-12 DIAGNOSIS — E782 Mixed hyperlipidemia: Secondary | ICD-10-CM

## 2020-04-12 DIAGNOSIS — I10 Essential (primary) hypertension: Secondary | ICD-10-CM

## 2020-04-12 MED ORDER — EZETIMIBE 10 MG PO TABS: 10.0000 mg | ORAL_TABLET | Freq: Every day | ORAL | 3 refills | Status: DC

## 2020-04-12 MED ORDER — ATORVASTATIN CALCIUM 10 MG PO TABS: 10.0000 mg | ORAL_TABLET | Freq: Every day | ORAL | 3 refills | Status: DC

## 2020-06-01 ENCOUNTER — Ambulatory Visit (INDEPENDENT_AMBULATORY_CARE_PROVIDER_SITE_OTHER): Payer: Medicare Other | Admitting: Neurology

## 2020-06-01 ENCOUNTER — Other Ambulatory Visit: Payer: Self-pay

## 2020-06-01 ENCOUNTER — Encounter: Payer: Self-pay | Admitting: Neurology

## 2020-06-01 VITALS — BP 125/65 | HR 67 | Ht 76.0 in

## 2020-06-01 DIAGNOSIS — I6501 Occlusion and stenosis of right vertebral artery: Secondary | ICD-10-CM

## 2020-06-01 DIAGNOSIS — R7309 Other abnormal glucose: Secondary | ICD-10-CM | POA: Diagnosis not present

## 2020-06-01 DIAGNOSIS — H538 Other visual disturbances: Secondary | ICD-10-CM | POA: Diagnosis not present

## 2020-06-01 DIAGNOSIS — R202 Paresthesia of skin: Secondary | ICD-10-CM | POA: Diagnosis not present

## 2020-06-01 DIAGNOSIS — R799 Abnormal finding of blood chemistry, unspecified: Secondary | ICD-10-CM | POA: Diagnosis not present

## 2020-06-01 DIAGNOSIS — R7989 Other specified abnormal findings of blood chemistry: Secondary | ICD-10-CM | POA: Diagnosis not present

## 2020-06-01 DIAGNOSIS — E038 Other specified hypothyroidism: Secondary | ICD-10-CM | POA: Diagnosis not present

## 2020-06-01 DIAGNOSIS — E538 Deficiency of other specified B group vitamins: Secondary | ICD-10-CM | POA: Diagnosis not present

## 2020-06-01 DIAGNOSIS — E559 Vitamin D deficiency, unspecified: Secondary | ICD-10-CM | POA: Diagnosis not present

## 2020-06-01 NOTE — Progress Notes (Signed)
Chief Complaint  Patient presents with  . Consult    He was refered back to our office for dizziness.   . Cardiology    Patwardhan, Anabel Bene, MD (referring provider)    HISTORICAL  Steven Padilla is a 69 year old male, seen in request by his primary care physician Dr. Felipa Eth, Joylene Draft for evaluation of dizziness.  I reviewed and summarized the referring note. PMHX. Chronic kidney disease HTN HLD  I saw him in September 2020 for similar but more severe episode.  Steven Padilla is a 69 years old male, seen in request by his primary care physician Dr. Felipa Eth, Joylene Draft for evaluation of blacking out spells, initial evaluation was on March 06, 2019.  He is the owner of apple orchard, works out side regularly, in the middle of August 2020, while working on the storage room,  he stood on the bench, felt sudden onset bilateral blurred vision, he was able to get down, holding on the scaffold by the site, lasting 10 to 15 seconds, then his vision came back, while in a standing position, he had a second episode bilateral vision went black, again lasted about 10 seconds, he was able to sit down, had a third episode, he denied loss of consciousness, no chest pain, heart palpitation,  He has been taking aspirin 81 mg daily,  Reviewed CT angiogram of February 19, 2019, normal appearance of brain, and her carotid artherosclerotic disease, 40% stenosis on the right side, less than 20% stenosis on the left side; 50 to 70% stenosis of right vertebral artery,  He is wearing 30 days cardiac monitoring, no arrhythmia noted.  ECHO in Sept 2020: EF 68%, normal global wall motion.  I personally reviewed MRI brain on Apr 02 2019 showed no acute abnormalities, enlargement of optic nerve sheaths,    Oct 2021, LD 78, Cholesterol 136, CP 132.  He did not have recurrent severe episode as described above, but he has occasionally lightheaded dizziness sensation when he moves suddenly, such as bending  over, standing up quickly, mild strong sensation, lasting for few seconds, then he can quickly establish equilibrium  He also complains of gradual onset bilateral feet paresthesia since 2016, he does keep good water intake  Blood pressure lying down 125/65, 57, sitting, 134/70, 63; standing 116/67, 74; standing for 3 minutes 116/69, 73   REVIEW OF SYSTEMS: Full 14 system review of systems performed and notable only for as above All other review of systems were negative.  ALLERGIES: Allergies  Allergen Reactions  . Codeine Anxiety  . Flexeril [Cyclobenzaprine]     HOME MEDICATIONS: Current Outpatient Medications  Medication Sig Dispense Refill  . aspirin EC 81 MG tablet Take 1 tablet by mouth daily.    Marland Kitchen atorvastatin (LIPITOR) 10 MG tablet Take 1 tablet (10 mg total) by mouth daily. 90 tablet 3  . ezetimibe (ZETIA) 10 MG tablet Take 1 tablet (10 mg total) by mouth daily. 90 tablet 3  . fluticasone (FLONASE) 50 MCG/ACT nasal spray Place into the nose.    . irbesartan (AVAPRO) 300 MG tablet Take 1 tablet by mouth daily.    . tamsulosin (FLOMAX) 0.4 MG CAPS capsule Take 0.4 mg by mouth daily after breakfast.    . traMADol (ULTRAM) 50 MG tablet Take 50 mg by mouth every 8 (eight) hours as needed for pain.     No current facility-administered medications for this visit.    PAST MEDICAL HISTORY: Past Medical History:  Diagnosis Date  . Bone  spur   . BPH with elevated PSA   . CKD (chronic kidney disease), stage III (HCC)   . Detached retina, left   . Hyperlipemia   . Hypertension   . Syncope   . Vision disturbance     PAST SURGICAL HISTORY: Past Surgical History:  Procedure Laterality Date  . CHOLECYSTECTOMY    . lt knee    . rt shoulder      FAMILY HISTORY: Family History  Problem Relation Age of Onset  . Hypertension Mother   . Hypertension Father   . Hypertension Brother   . Aneurysm Brother     SOCIAL HISTORY: Social History   Socioeconomic History  .  Marital status: Married    Spouse name: Not on file  . Number of children: 2  . Years of education: some college  . Highest education level: Not on file  Occupational History  . Occupation: owns apple orchard  . Occupation: retired Engineer, structural  Tobacco Use  . Smoking status: Former Smoker    Packs/day: 2.00    Years: 20.00    Pack years: 40.00    Types: Cigarettes, Pipe, Cigars    Quit date: 1983    Years since quitting: 38.9  . Smokeless tobacco: Former Neurosurgeon    Types: Engineer, drilling  . Vaping Use: Never used  Substance and Sexual Activity  . Alcohol use: Not Currently  . Drug use: Not Currently  . Sexual activity: Not on file  Other Topics Concern  . Not on file  Social History Narrative   Lives at home with his wife.   Ambidextrous.   Caffeine use:  One cup per day.   Social Determinants of Health   Financial Resource Strain:   . Difficulty of Paying Living Expenses: Not on file  Food Insecurity:   . Worried About Programme researcher, broadcasting/film/video in the Last Year: Not on file  . Ran Out of Food in the Last Year: Not on file  Transportation Needs:   . Lack of Transportation (Medical): Not on file  . Lack of Transportation (Non-Medical): Not on file  Physical Activity:   . Days of Exercise per Week: Not on file  . Minutes of Exercise per Session: Not on file  Stress:   . Feeling of Stress : Not on file  Social Connections:   . Frequency of Communication with Friends and Family: Not on file  . Frequency of Social Gatherings with Friends and Family: Not on file  . Attends Religious Services: Not on file  . Active Member of Clubs or Organizations: Not on file  . Attends Banker Meetings: Not on file  . Marital Status: Not on file  Intimate Partner Violence:   . Fear of Current or Ex-Partner: Not on file  . Emotionally Abused: Not on file  . Physically Abused: Not on file  . Sexually Abused: Not on file     PHYSICAL EXAM   Vitals:   06/01/20 0925  Height:  6\' 4"  (1.93 m)   Not recorded     Body mass index is 32.26 kg/m.  PHYSICAL EXAMNIATION:  Gen: NAD, conversant, well nourised, well groomed                     Cardiovascular: Regular rate rhythm, no peripheral edema, warm, nontender. Eyes: Conjunctivae clear without exudates or hemorrhage Neck: Supple, no carotid bruits. Pulmonary: Clear to auscultation bilaterally   NEUROLOGICAL EXAM:  MENTAL STATUS: Speech:  Speech is normal; fluent and spontaneous with normal comprehension.  Cognition:     Orientation to time, place and person     Normal recent and remote memory     Normal Attention span and concentration     Normal Language, naming, repeating,spontaneous speech     Fund of knowledge   CRANIAL NERVES: CN II: Visual fields are full to confrontation. Pupils are round equal and briskly reactive to light. CN III, IV, VI: extraocular movement are normal. No ptosis. CN V: Facial sensation is intact to light touch CN VII: Face is symmetric with normal eye closure  CN VIII: Hearing is normal to causal conversation. CN IX, X: Phonation is normal. CN XI: Head turning and shoulder shrug are intact  MOTOR: There is no pronator drift of out-stretched arms. Muscle bulk and tone are normal. Muscle strength is normal.  REFLEXES: Reflexes are 2+ and symmetric at the biceps, triceps, knees, and absent at ankles. Plantar responses are flexor.  SENSORY: Mildly length dependent decreased light touch, pinprick and vibratory sensation at toes  COORDINATION: There is no trunk or limb dysmetria noted.  GAIT/STANCE: He can get up from seated position arm crossed   DIAGNOSTIC DATA (LABS, IMAGING, TESTING) - I reviewed patient records, labs, notes, testing and imaging myself where available.   ASSESSMENT AND PLAN  Steven Padilla is a 69 y.o. male   Bilateral lower extremity paresthesia Positional related dizziness, vision changes,  Likely indicating small fiber  neuropathy  Borderline orthostatic blood pressure changes, likely explain his symptoms,  Laboratory evaluation for etiology of peripheral neuropathy  Encouraged him to continue water intake  Change Flomax 0.4 mg to every night, instead of after breakfast    Levert Feinstein, M.D. Ph.D.  [facility] [address], [address] [place], [place] [address] Ph: [phone] Fax: [phone]  CC:  Chilton Greathouse, MD [address] [place],  [place] [address]

## 2020-06-02 LAB — HIV ANTIBODY (ROUTINE TESTING W REFLEX): HIV Screen 4th Generation wRfx: NONREACTIVE

## 2020-06-02 LAB — TSH: TSH: 0.775 u[IU]/mL (ref 0.450–4.500)

## 2020-06-03 LAB — COMPREHENSIVE METABOLIC PANEL
ALT: 31 IU/L (ref 0–44)
AST: 30 IU/L (ref 0–40)
Albumin/Globulin Ratio: 1.5 (ref 1.2–2.2)
Albumin: 4.1 g/dL (ref 3.7–4.7)
Alkaline Phosphatase: 66 IU/L (ref 44–121)
BUN/Creatinine Ratio: 17 (ref 10–24)
BUN: 28 mg/dL — ABNORMAL HIGH (ref 8–27)
Bilirubin Total: 0.5 mg/dL (ref 0.0–1.2)
CO2: 20 mmol/L (ref 20–29)
Calcium: 9.4 mg/dL (ref 8.6–10.2)
Chloride: 109 mmol/L — ABNORMAL HIGH (ref 96–106)
Creatinine, Ser: 1.61 mg/dL — ABNORMAL HIGH (ref 0.76–1.27)
GFR calc Af Amer: 49 mL/min/{1.73_m2} — ABNORMAL LOW (ref >59)
GFR calc non Af Amer: 42 mL/min/{1.73_m2} — ABNORMAL LOW (ref >59)
Globulin, Total: 2.8 g/dL (ref 1.5–4.5)
Glucose: 107 mg/dL — ABNORMAL HIGH (ref 65–99)
Potassium: 5 mmol/L (ref 3.5–5.2)
Sodium: 143 mmol/L (ref 134–144)
Total Protein: 6.9 g/dL (ref 6.0–8.5)

## 2020-06-03 LAB — CBC WITH DIFFERENTIAL/PLATELET
Basophils Absolute: 0.1 10*3/uL (ref 0.0–0.2)
Basos: 1 %
EOS (ABSOLUTE): 0.1 10*3/uL (ref 0.0–0.4)
Eos: 2 %
Hematocrit: 41.9 % (ref 37.5–51.0)
Hemoglobin: 13.9 g/dL (ref 13.0–17.7)
Immature Grans (Abs): 0 10*3/uL (ref 0.0–0.1)
Immature Granulocytes: 0 %
Lymphocytes Absolute: 1.1 10*3/uL (ref 0.7–3.1)
Lymphs: 21 %
MCH: 30.4 pg (ref 26.6–33.0)
MCHC: 33.2 g/dL (ref 31.5–35.7)
MCV: 92 fL (ref 79–97)
Monocytes Absolute: 0.3 10*3/uL (ref 0.1–0.9)
Monocytes: 7 %
Neutrophils Absolute: 3.6 10*3/uL (ref 1.4–7.0)
Neutrophils: 69 %
RBC: 4.57 x10E6/uL (ref 4.14–5.80)
RDW: 12.5 % (ref 11.6–15.4)
WBC: 5.1 10*3/uL (ref 3.4–10.8)

## 2020-06-03 LAB — RPR: RPR Ser Ql: NONREACTIVE

## 2020-06-03 LAB — MULTIPLE MYELOMA PANEL, SERUM
Albumin SerPl Elph-Mcnc: 4 g/dL (ref 2.9–4.4)
Albumin/Glob SerPl: 1.4 (ref 0.7–1.7)
Alpha 1: 0.2 g/dL (ref 0.0–0.4)
Alpha2 Glob SerPl Elph-Mcnc: 0.6 g/dL (ref 0.4–1.0)
B-Globulin SerPl Elph-Mcnc: 0.9 g/dL (ref 0.7–1.3)
Gamma Glob SerPl Elph-Mcnc: 1.1 g/dL (ref 0.4–1.8)
Globulin, Total: 2.9 g/dL (ref 2.2–3.9)
IgA/Immunoglobulin A, Serum: 314 mg/dL (ref 61–437)
IgG (Immunoglobin G), Serum: 1099 mg/dL (ref 603–1613)
IgM (Immunoglobulin M), Srm: 80 mg/dL (ref 15–143)

## 2020-06-03 LAB — ANA W/REFLEX IF POSITIVE: Anti Nuclear Antibody (ANA): NEGATIVE

## 2020-06-03 LAB — VITAMIN D 25 HYDROXY (VIT D DEFICIENCY, FRACTURES): Vit D, 25-Hydroxy: 54.7 ng/mL (ref 30.0–100.0)

## 2020-06-03 LAB — CK: Total CK: 261 U/L (ref 41–331)

## 2020-06-03 LAB — FOLATE: Folate: >20 ng/mL (ref >3.0)

## 2020-06-03 LAB — VITAMIN B12: Vitamin B-12: 1130 pg/mL (ref 232–1245)

## 2020-06-03 LAB — SEDIMENTATION RATE: Sed Rate: 8 mm/hr (ref 0–30)

## 2020-06-03 LAB — HGB A1C W/O EAG: Hgb A1c MFr Bld: 5.6 % (ref 4.8–5.6)

## 2020-06-03 LAB — C-REACTIVE PROTEIN: CRP: 1 mg/L (ref 0–10)

## 2020-06-07 ENCOUNTER — Telehealth: Payer: Self-pay | Admitting: Neurology

## 2020-06-07 NOTE — Telephone Encounter (Signed)
I spoke to the patient and he verbalized understanding of his labs. States he has discussed his kidney function with his PCP in the past. He has increased his water intake and is taking NSAIDS sparingly.

## 2020-06-07 NOTE — Telephone Encounter (Signed)
Please call patient, the only abnormality had extensive laboratory evaluation was elevated creatinine 1.6, with GFR of 42  Rest of the laboratory evaluation showed no significant abnormalities.

## 2020-06-26 DIAGNOSIS — K56609 Unspecified intestinal obstruction, unspecified as to partial versus complete obstruction: Secondary | ICD-10-CM

## 2020-06-26 HISTORY — DX: Unspecified intestinal obstruction, unspecified as to partial versus complete obstruction: K56.609

## 2020-08-09 ENCOUNTER — Emergency Department (HOSPITAL_COMMUNITY)
Admission: EM | Admit: 2020-08-09 | Discharge: 2020-08-10 | Disposition: A | Discharge status: Home / Self Care | Payer: Medicare Other | Attending: Emergency Medicine | Admitting: Emergency Medicine

## 2020-08-09 ENCOUNTER — Emergency Department (HOSPITAL_COMMUNITY): Payer: Medicare Other

## 2020-08-09 ENCOUNTER — Other Ambulatory Visit: Payer: Self-pay

## 2020-08-09 DIAGNOSIS — Z79899 Other long term (current) drug therapy: Secondary | ICD-10-CM | POA: Diagnosis not present

## 2020-08-09 DIAGNOSIS — Z87891 Personal history of nicotine dependence: Secondary | ICD-10-CM | POA: Insufficient documentation

## 2020-08-09 DIAGNOSIS — K6389 Other specified diseases of intestine: Secondary | ICD-10-CM | POA: Diagnosis not present

## 2020-08-09 DIAGNOSIS — R1084 Generalized abdominal pain: Secondary | ICD-10-CM | POA: Diagnosis present

## 2020-08-09 DIAGNOSIS — K439 Ventral hernia without obstruction or gangrene: Secondary | ICD-10-CM | POA: Diagnosis not present

## 2020-08-09 DIAGNOSIS — K460 Unspecified abdominal hernia with obstruction, without gangrene: Secondary | ICD-10-CM | POA: Diagnosis not present

## 2020-08-09 DIAGNOSIS — Z20822 Contact with and (suspected) exposure to covid-19: Secondary | ICD-10-CM | POA: Diagnosis not present

## 2020-08-09 DIAGNOSIS — N183 Chronic kidney disease, stage 3 unspecified: Secondary | ICD-10-CM | POA: Insufficient documentation

## 2020-08-09 DIAGNOSIS — Z7982 Long term (current) use of aspirin: Secondary | ICD-10-CM | POA: Insufficient documentation

## 2020-08-09 DIAGNOSIS — I129 Hypertensive chronic kidney disease with stage 1 through stage 4 chronic kidney disease, or unspecified chronic kidney disease: Secondary | ICD-10-CM | POA: Insufficient documentation

## 2020-08-09 DIAGNOSIS — K469 Unspecified abdominal hernia without obstruction or gangrene: Secondary | ICD-10-CM | POA: Diagnosis not present

## 2020-08-09 DIAGNOSIS — N400 Enlarged prostate without lower urinary tract symptoms: Secondary | ICD-10-CM | POA: Diagnosis not present

## 2020-08-09 LAB — CBC WITH DIFFERENTIAL/PLATELET
Abs Immature Granulocytes: 0.01 10*3/uL (ref 0.00–0.07)
Basophils Absolute: 0.1 10*3/uL (ref 0.0–0.1)
Basophils Relative: 1 %
Eosinophils Absolute: 0.1 10*3/uL (ref 0.0–0.5)
Eosinophils Relative: 1 %
HCT: 41.9 % (ref 39.0–52.0)
Hemoglobin: 13.8 g/dL (ref 13.0–17.0)
Immature Granulocytes: 0 %
Lymphocytes Relative: 22 %
Lymphs Abs: 1.4 10*3/uL (ref 0.7–4.0)
MCH: 31.3 pg (ref 26.0–34.0)
MCHC: 32.9 g/dL (ref 30.0–36.0)
MCV: 95 fL (ref 80.0–100.0)
Monocytes Absolute: 0.4 10*3/uL (ref 0.1–1.0)
Monocytes Relative: 7 %
Neutro Abs: 4.3 10*3/uL (ref 1.7–7.7)
Neutrophils Relative %: 69 %
Platelets: 149 10*3/uL — ABNORMAL LOW (ref 150–400)
RBC: 4.41 MIL/uL (ref 4.22–5.81)
RDW: 12.9 % (ref 11.5–15.5)
WBC: 6.2 10*3/uL (ref 4.0–10.5)
nRBC: 0 % (ref 0.0–0.2)

## 2020-08-09 LAB — COMPREHENSIVE METABOLIC PANEL WITH GFR
ALT: 29 U/L (ref 0–44)
AST: 26 U/L (ref 15–41)
Albumin: 4.1 g/dL (ref 3.5–5.0)
Alkaline Phosphatase: 46 U/L (ref 38–126)
Anion gap: 9 (ref 5–15)
BUN: 27 mg/dL — ABNORMAL HIGH (ref 8–23)
CO2: 23 mmol/L (ref 22–32)
Calcium: 9.2 mg/dL (ref 8.9–10.3)
Chloride: 106 mmol/L (ref 98–111)
Creatinine, Ser: 1.64 mg/dL — ABNORMAL HIGH (ref 0.61–1.24)
GFR, Estimated: 44 mL/min — ABNORMAL LOW
Glucose, Bld: 135 mg/dL — ABNORMAL HIGH (ref 70–99)
Potassium: 4 mmol/L (ref 3.5–5.1)
Sodium: 138 mmol/L (ref 135–145)
Total Bilirubin: 1 mg/dL (ref 0.3–1.2)
Total Protein: 6.9 g/dL (ref 6.5–8.1)

## 2020-08-09 LAB — RESP PANEL BY RT-PCR (FLU A&B, COVID) ARPGX2
Influenza A by PCR: NEGATIVE
Influenza B by PCR: NEGATIVE
SARS Coronavirus 2 by RT PCR: NEGATIVE

## 2020-08-09 MED ORDER — SODIUM CHLORIDE 0.9 % IV BOLUS (SEPSIS)
500.0000 mL | Freq: Once | INTRAVENOUS | Status: AC
  Administered 2020-08-09: 500 mL via INTRAVENOUS

## 2020-08-09 MED ORDER — IOHEXOL 300 MG/ML  SOLN
75.0000 mL | Freq: Once | INTRAMUSCULAR | Status: AC | PRN
  Administered 2020-08-09: 75 mL via INTRAVENOUS

## 2020-08-09 MED ORDER — MORPHINE SULFATE (PF) 4 MG/ML IV SOLN
8.0000 mg | Freq: Once | INTRAVENOUS | Status: AC
  Administered 2020-08-09: 8 mg via INTRAVENOUS
  Filled 2020-08-09: qty 2

## 2020-08-09 MED ORDER — LORAZEPAM 2 MG/ML IJ SOLN
1.0000 mg | Freq: Once | INTRAMUSCULAR | Status: AC
  Administered 2020-08-09: 1 mg via INTRAVENOUS
  Filled 2020-08-09: qty 1

## 2020-08-09 MED ORDER — ONDANSETRON HCL 4 MG/2ML IJ SOLN
4.0000 mg | Freq: Once | INTRAMUSCULAR | Status: AC
  Administered 2020-08-09: 4 mg via INTRAVENOUS
  Filled 2020-08-09: qty 2

## 2020-08-09 NOTE — ED Notes (Signed)
ED Provider at bedside. 

## 2020-08-09 NOTE — ED Triage Notes (Signed)
Pt here for abdominal hernia pain . States he has had it for a few years but today he thinks his intestines have went through it and now it is firm and painful to the touch.

## 2020-08-09 NOTE — ED Notes (Signed)
Patient transported to CT 

## 2020-08-09 NOTE — Discharge Instructions (Addendum)
Call Dr. York Ram office in the morning - he wants to see you tomorrow afternoon just tell the office that he want you to have an afternoon appointment

## 2020-08-09 NOTE — ED Provider Notes (Signed)
[facility] EMERGENCY DEPARTMENT Provider Note   CSN: [id number] Arrival date & time: 08/09/20  2031     History Chief Complaint  Patient presents with  . Hernia    Steven Padilla is a 70 y.o. male.  Patient started around 7 PM with severe abdominal pain.  He has abdominal hernia that is hurting  The history is provided by the patient and medical records. No language interpreter was used.  Abdominal Pain Pain location:  Generalized Pain quality: aching   Pain radiates to:  Does not radiate Pain severity:  Moderate Onset quality:  Sudden Timing:  Constant Progression:  Worsening Chronicity:  Recurrent Context: not alcohol use   Relieved by:  Nothing Associated symptoms: no chest pain, no cough, no diarrhea, no fatigue and no hematuria        Past Medical History:  Diagnosis Date  . Bone spur   . BPH with elevated PSA   . CKD (chronic kidney disease), stage III (HCC)   . Detached retina, left   . Hyperlipemia   . Hypertension   . Syncope   . Vision disturbance     Patient Active Problem List   Diagnosis Date Noted  . Paresthesia 06/01/2020  . Stenosis of right vertebral artery 04/12/2020  . Statin intolerance 07/17/2019  . Blurry vision, bilateral 03/06/2019  . Mixed hyperlipidemia 02/22/2019  . Syncope and collapse 02/21/2019  . Dizziness 02/21/2019  . Essential hypertension 02/21/2019    Past Surgical History:  Procedure Laterality Date  . CHOLECYSTECTOMY    . lt knee    . rt shoulder         Family History  Problem Relation Age of Onset  . Hypertension Mother   . Hypertension Father   . Hypertension Brother   . Aneurysm Brother     Social History   Tobacco Use  . Smoking status: Former Smoker    Packs/day: 2.00    Years: 20.00    Pack years: 40.00    Types: Cigarettes, Pipe, Cigars    Quit date: 1983    Years since quitting: 39.1  . Smokeless tobacco: Former Neurosurgeon    Types: Engineer, drilling  . Vaping Use: Never used  Substance  Use Topics  . Alcohol use: Not Currently  . Drug use: Not Currently    Home Medications Prior to Admission medications   Medication Sig Start Date End Date Taking? Authorizing Provider  aspirin EC 81 MG tablet Take 1 tablet by mouth daily.    [provider]  atorvastatin (LIPITOR) 10 MG tablet Take 1 tablet (10 mg total) by mouth daily. 04/12/20   Patwardhan, Anabel Bene, MD  ezetimibe (ZETIA) 10 MG tablet Take 1 tablet (10 mg total) by mouth daily. 04/12/20   Patwardhan, Anabel Bene, MD  fluticasone (FLONASE) 50 MCG/ACT nasal spray Place into the nose.    [provider]  irbesartan (AVAPRO) 300 MG tablet Take 1 tablet by mouth daily. 12/21/18   [provider]  tamsulosin (FLOMAX) 0.4 MG CAPS capsule Take 0.4 mg by mouth daily after breakfast.    [provider]  traMADol (ULTRAM) 50 MG tablet Take 50 mg by mouth every 8 (eight) hours as needed for pain.    [provider]    Allergies    Codeine and Flexeril [cyclobenzaprine]  Review of Systems   Review of Systems  Constitutional: Negative for appetite change and fatigue.  HENT: Negative for congestion, ear discharge and sinus pressure.  Eyes: Negative for discharge.  Respiratory: Negative for cough.   Cardiovascular: Negative for chest pain.  Gastrointestinal: Positive for abdominal pain. Negative for diarrhea.  Genitourinary: Negative for frequency and hematuria.  Musculoskeletal: Negative for back pain.  Skin: Negative for rash.  Neurological: Negative for seizures and headaches.  Psychiatric/Behavioral: Negative for hallucinations.    Physical Exam Updated Vital Signs BP (!) 104/58   Pulse (!) 55   Temp 98.7 F (37.1 C)   Resp 18   Ht 6\' 4"  (1.93 m)   Wt 117.9 kg   SpO2 90%   BMI 31.65 kg/m   Physical Exam Vitals and nursing note reviewed.  Constitutional:      Appearance: He is well-developed.  HENT:     Head: Normocephalic.     Mouth/Throat:     Mouth: Mucous  membranes are moist.  Eyes:     General: No scleral icterus.    Extraocular Movements: EOM normal.     Conjunctiva/sclera: Conjunctivae normal.  Neck:     Thyroid: No thyromegaly.  Cardiovascular:     Rate and Rhythm: Normal rate and regular rhythm.     Heart sounds: No murmur heard. No friction rub. No gallop.   Pulmonary:     Breath sounds: No stridor. No wheezing or rales.  Chest:     Chest wall: No tenderness.  Abdominal:     General: There is no distension.     Tenderness: There is abdominal tenderness. There is no rebound.     Comments: Incarcerated abd hernia  Musculoskeletal:        General: No edema. Normal range of motion.     Cervical back: Neck supple.  Lymphadenopathy:     Cervical: No cervical adenopathy.  Skin:    Findings: No erythema or rash.  Neurological:     Mental Status: He is alert and oriented to person, place, and time.     Motor: No abnormal muscle tone.     Coordination: Coordination normal.  Psychiatric:        Mood and Affect: Mood and affect normal.        Behavior: Behavior normal.     ED Results / Procedures / Treatments   Labs (all labs ordered are listed, but only abnormal results are displayed) Labs Reviewed  CBC WITH DIFFERENTIAL/PLATELET - Abnormal; Notable for the following components:      Result Value   Platelets 149 (*)    All other components within normal limits  COMPREHENSIVE METABOLIC PANEL - Abnormal; Notable for the following components:   Glucose, Bld 135 (*)    BUN 27 (*)    Creatinine, Ser 1.64 (*)    GFR, Estimated 44 (*)    All other components within normal limits  RESP PANEL BY RT-PCR (FLU A&B, COVID) [id number]    EKG None  Radiology CT ABDOMEN PELVIS W CONTRAST  Result Date: 08/09/2020 CLINICAL DATA:  Hernia pain EXAM: CT ABDOMEN AND PELVIS WITH CONTRAST TECHNIQUE: Multidetector CT imaging of the abdomen and pelvis was performed using the standard protocol following bolus administration of intravenous  contrast. CONTRAST:  16mL OMNIPAQUE IOHEXOL 300 MG/ML  SOLN COMPARISON:  None. FINDINGS: Lower chest: Lung bases demonstrate subpleural scarring or atelectasis. No acute consolidation or effusion. Coronary vascular calcification. Borderline cardiomegaly. Hepatobiliary: No focal liver abnormality is seen. Status post cholecystectomy. No biliary dilatation. Pancreas: Unremarkable. No pancreatic ductal dilatation or surrounding inflammatory changes. Spleen: Normal in size without focal abnormality. Adrenals/Urinary Tract: Adrenal glands are unremarkable. Kidneys  are normal, without renal calculi, focal lesion, or hydronephrosis. Bladder is unremarkable. Stomach/Bowel: The stomach is nonenlarged. Dilated fluid-filled mid small bowel measuring up to 3.2 cm with transition point related to supraumbilical ventral hernia that contains fluid-filled dilated small bowel. Exiting small bowel is decompressed and small bowel distal to the hernia is also decompressed. No acute bowel wall thickening. Negative appendix. Vascular/Lymphatic: Moderate aortic atherosclerosis. No aneurysm. No suspicious nodes Reproductive: Enlarged prostate with mass effect on the bladder Other: Negative for free air or free fluid. Musculoskeletal: No acute or significant osseous findings. IMPRESSION: 1. Findings consistent with a small bowel obstruction secondary to incarcerated supraumbilical ventral hernia that contains fat and small bowel. 2. Enlarged prostate with mass effect on the bladder. Aortic Atherosclerosis (ICD10-I70.0). Electronically Signed   By: Jasmine Pang M.D.   On: 08/09/2020 22:53    Procedures Procedures   Medications Ordered in ED Medications  morphine 4 MG/ML injection 8 mg (8 mg Intravenous Given 08/09/20 2121)  LORazepam (ATIVAN) injection 1 mg (1 mg Intravenous Given 08/09/20 2121)  ondansetron (ZOFRAN) injection 4 mg (4 mg Intravenous Given 08/09/20 2121)  sodium chloride 0.9 % bolus 500 mL (500 mLs Intravenous New  Bag/Given 08/09/20 2121)  iohexol (OMNIPAQUE) 300 MG/ML solution 75 mL (75 mLs Intravenous Contrast Given 08/09/20 2225)  morphine 4 MG/ML injection 8 mg (8 mg Intravenous Given 08/09/20 2257)  procedure  I reduced the hernia after significant pain medicine.   The ct was done before the reduction ED Course  I have reviewed the triage vital signs and the nursing notes.  Pertinent labs & imaging results that were available during my care of the patient were reviewed by me and considered in my medical decision making (see chart for details).   Patient had an incarcerated abdominal hernia that was reduced after giving the patient significant morphine and Ativan.  He will follow up with general surgery tomorrow.  I spoke with Dr. Lovell Sheehan. MDM Rules/Calculators/A&P                         Incarcerated abdominal hernia that has been reduced Final Clinical Impression(s) / ED Diagnoses Final diagnoses:  Incarcerated hernia    Rx / DC Orders ED Discharge Orders    None       Bethann Berkshire, MD 08/11/20 1139

## 2020-08-10 ENCOUNTER — Encounter: Payer: Self-pay | Admitting: General Surgery

## 2020-08-10 ENCOUNTER — Ambulatory Visit (INDEPENDENT_AMBULATORY_CARE_PROVIDER_SITE_OTHER): Payer: Medicare Other | Admitting: General Surgery

## 2020-08-10 VITALS — BP 116/71 | HR 52 | Temp 96.9°F | Resp 16 | Ht 76.5 in | Wt 260.0 lb

## 2020-08-10 DIAGNOSIS — K432 Incisional hernia without obstruction or gangrene: Secondary | ICD-10-CM

## 2020-08-10 NOTE — ED Provider Notes (Signed)
Recheck of patient, patient is awake and alert.  No complaints.  Hernia has been reduced, no recurrence.  Discharge and follow-up with Dr. Lovell Sheehan as planned.   Gilda Crease, MD 08/10/20 0030

## 2020-08-10 NOTE — Patient Instructions (Signed)

## 2020-08-10 NOTE — H&P (Signed)
Steven Padilla; [id number]; 30-Jun-1950   HPI Patient is a 70 year old white male who was referred to my care by Dr. Estell Harpin in the emergency room for evaluation treatment of an incarcerated ventral hernia. He presented to the emergency room yesterday evening with a partial bowel obstruction secondary to an incarcerated hernia. The hernia was reduced and the patient was discharged. Patient states he has had known supraumbilical hernia for some time now, but this was the first episode of incarceration. He usually is able to reduce it on its own. He is status post laparoscopic cholecystectomy in the remote past. He currently is sore but is not having any nausea or vomiting. Past Medical History:  Diagnosis Date  . Bone spur   . BPH with elevated PSA   . CKD (chronic kidney disease), stage III (HCC)   . Detached retina, left   . Hyperlipemia   . Hypertension   . Syncope   . Vision disturbance     Past Surgical History:  Procedure Laterality Date  . CHOLECYSTECTOMY    . lt knee    . rt shoulder      Family History  Problem Relation Age of Onset  . Hypertension Mother   . Hypertension Father   . Hypertension Brother   . Aneurysm Brother     Current Outpatient Medications on File Prior to Visit  Medication Sig Dispense Refill  . aspirin EC 81 MG tablet Take 1 tablet by mouth daily.    Marland Kitchen atorvastatin (LIPITOR) 10 MG tablet Take 1 tablet (10 mg total) by mouth daily. 90 tablet 3  . ezetimibe (ZETIA) 10 MG tablet Take 1 tablet (10 mg total) by mouth daily. 90 tablet 3  . fluticasone (FLONASE) 50 MCG/ACT nasal spray Place into the nose.    . irbesartan (AVAPRO) 300 MG tablet Take 1 tablet by mouth daily.    . tamsulosin (FLOMAX) 0.4 MG CAPS capsule Take 0.4 mg by mouth daily after breakfast.    . traMADol (ULTRAM) 50 MG tablet Take 50 mg by mouth every 8 (eight) hours as needed for pain.     No current facility-administered medications on file prior to visit.    Allergies  Allergen  Reactions  . Codeine Anxiety  . Flexeril [Cyclobenzaprine]     Social History   Substance and Sexual Activity  Alcohol Use Not Currently    Social History   Tobacco Use  Smoking Status Former Smoker  . Packs/day: 2.00  . Years: 20.00  . Pack years: 40.00  . Types: Cigarettes, Pipe, Cigars  . Quit date: 69  . Years since quitting: 39.1  Smokeless Tobacco Former Neurosurgeon  . Types: Chew    Review of Systems  Constitutional: Negative.   HENT: Positive for sinus pain.   Eyes: Positive for blurred vision.  Respiratory: Negative.   Cardiovascular: Negative.   Gastrointestinal: Positive for abdominal pain.  Genitourinary: Positive for frequency.  Musculoskeletal: Positive for back pain, joint pain and neck pain.  Skin: Negative.   Neurological: Positive for dizziness.  Endo/Heme/Allergies: Negative.   Psychiatric/Behavioral: Negative.     Objective   Vitals:   08/10/20 1429  BP: 116/71  Pulse: (!) 52  Resp: 16  Temp: (!) 96.9 F (36.1 C)  SpO2: 92%    Physical Exam Vitals reviewed.  Constitutional:      Appearance: Normal appearance. He is not ill-appearing.  HENT:     Head: Normocephalic and atraumatic.  Cardiovascular:     Rate and Rhythm: Normal  rate and regular rhythm.     Heart sounds: Normal heart sounds. No murmur heard. No friction rub. No gallop.   Pulmonary:     Effort: Pulmonary effort is normal. No respiratory distress.     Breath sounds: Normal breath sounds. No stridor. No wheezing, rhonchi or rales.  Abdominal:     General: Bowel sounds are normal. There is no distension.     Palpations: Abdomen is soft. There is no mass.     Tenderness: There is no abdominal tenderness. There is no guarding or rebound.     Hernia: A hernia is present.     Comments: Reducible supraumbilical hernia below a surgical scar  Skin:    General: Skin is warm and dry.  Neurological:     Mental Status: He is alert and oriented to person, place, and time.   ER  notes reviewed CT scan images personally reviewed  Assessment  Incisional hernia, recent episode of incarceration Plan   Patient is scheduled for an incisional herniorrhaphy with mesh on 08/18/2020. The risks and benefits of the procedure including bleeding, infection, mesh use, and the possibility of recurrence of the hernia were fully explained to the patient, who gave informed consent.

## 2020-08-10 NOTE — Progress Notes (Signed)
Steven Padilla; [id number]; 02/24/1951   HPI Patient is a 70-year-old white male who was referred to my care by Dr. Zammit in the emergency room for evaluation treatment of an incarcerated ventral hernia. He presented to the emergency room yesterday evening with a partial bowel obstruction secondary to an incarcerated hernia. The hernia was reduced and the patient was discharged. Patient states he has had known supraumbilical hernia for some time now, but this was the first episode of incarceration. He usually is able to reduce it on its own. He is status post laparoscopic cholecystectomy in the remote past. He currently is sore but is not having any nausea or vomiting. Past Medical History:  Diagnosis Date  . Bone spur   . BPH with elevated PSA   . CKD (chronic kidney disease), stage III (HCC)   . Detached retina, left   . Hyperlipemia   . Hypertension   . Syncope   . Vision disturbance     Past Surgical History:  Procedure Laterality Date  . CHOLECYSTECTOMY    . lt knee    . rt shoulder      Family History  Problem Relation Age of Onset  . Hypertension Mother   . Hypertension Father   . Hypertension Brother   . Aneurysm Brother     Current Outpatient Medications on File Prior to Visit  Medication Sig Dispense Refill  . aspirin EC 81 MG tablet Take 1 tablet by mouth daily.    . atorvastatin (LIPITOR) 10 MG tablet Take 1 tablet (10 mg total) by mouth daily. 90 tablet 3  . ezetimibe (ZETIA) 10 MG tablet Take 1 tablet (10 mg total) by mouth daily. 90 tablet 3  . fluticasone (FLONASE) 50 MCG/ACT nasal spray Place into the nose.    . irbesartan (AVAPRO) 300 MG tablet Take 1 tablet by mouth daily.    . tamsulosin (FLOMAX) 0.4 MG CAPS capsule Take 0.4 mg by mouth daily after breakfast.    . traMADol (ULTRAM) 50 MG tablet Take 50 mg by mouth every 8 (eight) hours as needed for pain.     No current facility-administered medications on file prior to visit.    Allergies  Allergen  Reactions  . Codeine Anxiety  . Flexeril [Cyclobenzaprine]     Social History   Substance and Sexual Activity  Alcohol Use Not Currently    Social History   Tobacco Use  Smoking Status Former Smoker  . Packs/day: 2.00  . Years: 20.00  . Pack years: 40.00  . Types: Cigarettes, Pipe, Cigars  . Quit date: 1983  . Years since quitting: 39.1  Smokeless Tobacco Former User  . Types: Chew    Review of Systems  Constitutional: Negative.   HENT: Positive for sinus pain.   Eyes: Positive for blurred vision.  Respiratory: Negative.   Cardiovascular: Negative.   Gastrointestinal: Positive for abdominal pain.  Genitourinary: Positive for frequency.  Musculoskeletal: Positive for back pain, joint pain and neck pain.  Skin: Negative.   Neurological: Positive for dizziness.  Endo/Heme/Allergies: Negative.   Psychiatric/Behavioral: Negative.     Objective   Vitals:   08/10/20 1429  BP: 116/71  Pulse: (!) 52  Resp: 16  Temp: (!) 96.9 F (36.1 C)  SpO2: 92%    Physical Exam Vitals reviewed.  Constitutional:      Appearance: Normal appearance. He is not ill-appearing.  HENT:     Head: Normocephalic and atraumatic.  Cardiovascular:     Rate and Rhythm: Normal   rate and regular rhythm.     Heart sounds: Normal heart sounds. No murmur heard. No friction rub. No gallop.   Pulmonary:     Effort: Pulmonary effort is normal. No respiratory distress.     Breath sounds: Normal breath sounds. No stridor. No wheezing, rhonchi or rales.  Abdominal:     General: Bowel sounds are normal. There is no distension.     Palpations: Abdomen is soft. There is no mass.     Tenderness: There is no abdominal tenderness. There is no guarding or rebound.     Hernia: A hernia is present.     Comments: Reducible supraumbilical hernia below a surgical scar  Skin:    General: Skin is warm and dry.  Neurological:     Mental Status: He is alert and oriented to person, place, and time.   ER  notes reviewed CT scan images personally reviewed  Assessment  Incisional hernia, recent episode of incarceration Plan   Patient is scheduled for an incisional herniorrhaphy with mesh on 08/18/2020. The risks and benefits of the procedure including bleeding, infection, mesh use, and the possibility of recurrence of the hernia were fully explained to the patient, who gave informed consent.  

## 2020-08-13 ENCOUNTER — Encounter (HOSPITAL_COMMUNITY)
Admission: RE | Admit: 2020-08-13 | Discharge: 2020-08-13 | Disposition: A | Discharge status: Home / Self Care | Payer: Medicare Other | Source: Ambulatory Visit | Attending: General Surgery | Admitting: General Surgery

## 2020-08-13 ENCOUNTER — Encounter (HOSPITAL_COMMUNITY): Payer: Self-pay

## 2020-08-13 ENCOUNTER — Other Ambulatory Visit: Payer: Self-pay

## 2020-08-13 DIAGNOSIS — Z01818 Encounter for other preprocedural examination: Secondary | ICD-10-CM | POA: Insufficient documentation

## 2020-08-13 NOTE — Patient Instructions (Signed)
Steven Padilla  08/13/2020     @PREFPERIOPPHARMACY @   Your procedure is scheduled on 08/18/2020.   Report to 08/20/2020 at  1105  A.M.   Call this number if you have problems the morning of surgery:  [phone]   Remember:  Do not eat or drink after midnight.                       Take these medicines the morning of surgery with A SIP OF WATER  Flomax, tramadol (if needed).    Please brush your teeth.   Do not wear jewelry, make-up or nail polish.  Do not wear lotions, powders, or perfumes, or deodorant.  Do not shave 48 hours prior to surgery.  Men may shave face and neck.  Do not bring valuables to the hospital.  [facility] is not responsible for any belongings or valuables.  Contacts, dentures or bridgework may not be worn into surgery.  Leave your suitcase in the car.  After surgery it may be brought to your room.  For patients admitted to the hospital, discharge time will be determined by your treatment team.  Patients discharged the day of surgery will not be allowed to drive home and must have somone with them for 24 hours.   Special instructions:  DO NOT smoke tobacco or vape the morning of your procedure.     Shower with CHG the night before and the morning of your surgery with CHG. DO NOT put CHG on your face, hair or genitals.     Place clean sheets on your bed the night before your procedure and do not sleep with pets this night.  After each shower, dry off with a clean towel, put on clean clothes and brush your teeth.  Please read over the following fact sheets that you were given. Coughing and Deep Breathing, Surgical Site Infection Prevention, Anesthesia Post-op Instructions and Care and Recovery After Surgery       Open Hernia Repair, Adult, Care After What can I expect after the procedure? After the procedure, it is common to have:  Mild discomfort.  Slight bruising.  Mild swelling.  Pain in the belly (abdomen).  A small  amount of blood from the cut from surgery (incision). Follow these instructions at home: Your doctor may give you more specific instructions. If you have problems, call your doctor. Medicines  Take over-the-counter and prescription medicines only as told by your doctor.  If told, take steps to prevent problems with pooping (constipation). You may need to: ? Drink enough fluid to keep your pee (urine) pale yellow. ? Take medicines. You will be told what medicines to take. ? Eat foods that are high in fiber. These include beans, whole grains, and fresh fruits and vegetables. ? Limit foods that are high in fat and sugar. These include fried or sweet foods.  Ask your doctor if you should avoid driving or using machines while you are taking your medicine. Incision care  Follow instructions from your doctor about how to take care of your incision. Make sure you: ? Wash your hands with soap and water for at least 20 seconds before and after you change your bandage (dressing). If you cannot use soap and water, use hand sanitizer. ? Change your bandage. ? Leave stitches or skin glue in place for at least 2 weeks. ? Leave tape strips alone unless you are told to take them off.  You may trim the edges of the tape strips if they curl up.  Check your incision every day for signs of infection. Check for: ? More redness, swelling, or pain. ? More fluid or blood. ? Warmth. ? Pus or a bad smell.  Wear loose, soft clothing while your incision heals.   Activity  Rest as told by your doctor.  Do not lift anything that is heavier than 10 lb (4.5 kg), or the limit that you are told.  Do not play contact sports until your doctor says that this is safe.  If you were given a sedative during your procedure, do not drive or use machines until your doctor says that it is safe. A sedative is a medicine that helps you relax.  Return to your normal activities when your doctor says that it is safe.   General  instructions  Do not take baths, swim, or use a hot tub. Ask your doctor about taking showers or sponge baths.  Hold a pillow over your belly when you cough or sneeze. This helps with pain.  Do not smoke or use any products that contain nicotine or tobacco. If you need help quitting, ask your doctor.  Keep all follow-up visits. Contact a doctor if:  You have any of these signs of infection in or around your incision: ? More redness, swelling, or pain. ? More fluid or blood. ? Warmth. ? Pus. ? A bad smell.  You have a fever or chills.  You have blood in your poop (stool).  You have not pooped (had a bowel movement) in 2-3 days.  Medicine does not help your pain. Get help right away if:  You have chest pain, or you are short of breath.  You feel faint or light-headed.  You have very bad pain.  You vomit and your pain is worse.  You have pain, swelling, or redness in a leg. These symptoms may be an emergency. Get help right away. Call your local emergency services (911 in the [place]  Do not wait to see if the symptoms will go away.  Do not drive yourself to the hospital. Summary  After this procedure, it is common to have mild discomfort, slight bruising, and mild swelling.  Follow instructions from your doctor about how to take care of your cut from surgery (incision). Check every day for signs of infection.  Do not lift heavy objects or play contact sports until your doctor says it is safe.  Return to your normal activities as told by your doctor. This information is not intended to replace advice given to you by your health care provider. Make sure you discuss any questions you have with your health care provider. Document Revised: 01/26/2020 Document Reviewed: 01/26/2020 Elsevier Patient Education  2021 [employer] Monitored Anesthesia Care, Care After This sheet gives you information about how to care for yourself after your procedure. Your health care  provider may also give you more specific instructions. If you have problems or questions, contact your health care provider. What can I expect after the procedure? After the procedure, it is common to have:  Tiredness.  Forgetfulness about what happened after the procedure.  Impaired judgment for important decisions.  Nausea or vomiting.  Some difficulty with balance. Follow these instructions at home: For the time period you were told by your health care provider:  Rest as needed.  Do not participate in activities where you could fall or become injured.  Do not drive or use  machinery.  Do not drink alcohol.  Do not take sleeping pills or medicines that cause drowsiness.  Do not make important decisions or sign legal documents.  Do not take care of children on your own.      Eating and drinking  Follow the diet that is recommended by your health care provider.  Drink enough fluid to keep your urine pale yellow.  If you vomit: ? Drink water, juice, or soup when you can drink without vomiting. ? Make sure you have little or no nausea before eating solid foods. General instructions  Have a responsible adult stay with you for the time you are told. It is important to have someone help care for you until you are awake and alert.  Take over-the-counter and prescription medicines only as told by your health care provider.  If you have sleep apnea, surgery and certain medicines can increase your risk for breathing problems. Follow instructions from your health care provider about wearing your sleep device: ? Anytime you are sleeping, including during daytime naps. ? While taking prescription pain medicines, sleeping medicines, or medicines that make you drowsy.  Avoid smoking.  Keep all follow-up visits as told by your health care provider. This is important. Contact a health care provider if:  You keep feeling nauseous or you keep vomiting.  You feel  light-headed.  You are still sleepy or having trouble with balance after 24 hours.  You develop a rash.  You have a fever.  You have redness or swelling around the IV site. Get help right away if:  You have trouble breathing.  You have new-onset confusion at home. Summary  For several hours after your procedure, you may feel tired. You may also be forgetful and have poor judgment.  Have a responsible adult stay with you for the time you are told. It is important to have someone help care for you until you are awake and alert.  Rest as told. Do not drive or operate machinery. Do not drink alcohol or take sleeping pills.  Get help right away if you have trouble breathing, or if you suddenly become confused. This information is not intended to replace advice given to you by your health care provider. Make sure you discuss any questions you have with your health care provider. Document Revised: 02/26/2020 Document Reviewed: 05/15/2019 Elsevier Patient Education  2021 [employer].

## 2020-08-16 ENCOUNTER — Other Ambulatory Visit: Payer: Self-pay

## 2020-08-16 ENCOUNTER — Other Ambulatory Visit (HOSPITAL_COMMUNITY)
Admission: RE | Admit: 2020-08-16 | Discharge: 2020-08-16 | Disposition: A | Discharge status: Home / Self Care | Payer: Medicare Other | Source: Ambulatory Visit | Attending: General Surgery | Admitting: General Surgery

## 2020-08-16 DIAGNOSIS — Z20822 Contact with and (suspected) exposure to covid-19: Secondary | ICD-10-CM | POA: Insufficient documentation

## 2020-08-16 DIAGNOSIS — Z01812 Encounter for preprocedural laboratory examination: Secondary | ICD-10-CM | POA: Diagnosis not present

## 2020-08-17 LAB — SARS CORONAVIRUS 2 (TAT 6-24 HRS): SARS Coronavirus 2: NEGATIVE

## 2020-08-18 ENCOUNTER — Ambulatory Visit (HOSPITAL_COMMUNITY): Payer: Medicare Other | Admitting: Anesthesiology

## 2020-08-18 ENCOUNTER — Ambulatory Visit (HOSPITAL_COMMUNITY)
Admission: RE | Admit: 2020-08-18 | Discharge: 2020-08-18 | Disposition: A | Discharge status: Home / Self Care | Payer: Medicare Other | Attending: General Surgery | Admitting: General Surgery

## 2020-08-18 ENCOUNTER — Other Ambulatory Visit: Payer: Self-pay

## 2020-08-18 ENCOUNTER — Encounter (HOSPITAL_COMMUNITY)
Admission: RE | Disposition: A | Discharge status: Home / Self Care | Payer: Self-pay | Source: Home / Self Care | Attending: General Surgery

## 2020-08-18 ENCOUNTER — Encounter (HOSPITAL_COMMUNITY): Payer: Self-pay | Admitting: General Surgery

## 2020-08-18 DIAGNOSIS — K432 Incisional hernia without obstruction or gangrene: Secondary | ICD-10-CM | POA: Diagnosis not present

## 2020-08-18 DIAGNOSIS — Z87891 Personal history of nicotine dependence: Secondary | ICD-10-CM | POA: Insufficient documentation

## 2020-08-18 DIAGNOSIS — I129 Hypertensive chronic kidney disease with stage 1 through stage 4 chronic kidney disease, or unspecified chronic kidney disease: Secondary | ICD-10-CM | POA: Diagnosis not present

## 2020-08-18 DIAGNOSIS — Z7982 Long term (current) use of aspirin: Secondary | ICD-10-CM | POA: Insufficient documentation

## 2020-08-18 DIAGNOSIS — Z9049 Acquired absence of other specified parts of digestive tract: Secondary | ICD-10-CM | POA: Diagnosis not present

## 2020-08-18 DIAGNOSIS — Z79899 Other long term (current) drug therapy: Secondary | ICD-10-CM | POA: Insufficient documentation

## 2020-08-18 DIAGNOSIS — N183 Chronic kidney disease, stage 3 unspecified: Secondary | ICD-10-CM | POA: Diagnosis not present

## 2020-08-18 HISTORY — PX: INCISIONAL HERNIA REPAIR: SHX193

## 2020-08-18 SURGERY — REPAIR, HERNIA, INCISIONAL
Anesthesia: General | Site: Abdomen

## 2020-08-18 MED ORDER — TRAMADOL HCL 50 MG PO TABS: 50.0000 mg | ORAL_TABLET | Freq: Four times a day (QID) | ORAL | 0 refills | Status: AC | PRN

## 2020-08-18 MED ORDER — BUPIVACAINE LIPOSOME 1.3 % IJ SUSP
INTRAMUSCULAR | Status: DC | PRN
  Administered 2020-08-18: 13 mL

## 2020-08-18 MED ORDER — CHLORHEXIDINE GLUCONATE 0.12 % MT SOLN
OROMUCOSAL | Status: AC
  Filled 2020-08-18: qty 15

## 2020-08-18 MED ORDER — 0.9 % SODIUM CHLORIDE (POUR BTL) OPTIME
TOPICAL | Status: DC | PRN
  Administered 2020-08-18: 1000 mL

## 2020-08-18 MED ORDER — ORAL CARE MOUTH RINSE: 15.0000 mL | Freq: Once | OROMUCOSAL | Status: AC

## 2020-08-18 MED ORDER — CHLORHEXIDINE GLUCONATE 0.12 % MT SOLN
15.0000 mL | Freq: Once | OROMUCOSAL | Status: AC
  Administered 2020-08-18: 15 mL via OROMUCOSAL

## 2020-08-18 MED ORDER — KETOROLAC TROMETHAMINE 30 MG/ML IJ SOLN
30.0000 mg | Freq: Once | INTRAMUSCULAR | Status: AC
  Administered 2020-08-18: 30 mg via INTRAVENOUS
  Filled 2020-08-18: qty 1

## 2020-08-18 MED ORDER — MIDAZOLAM HCL 5 MG/5ML IJ SOLN
INTRAMUSCULAR | Status: DC | PRN
  Administered 2020-08-18: 2 mg via INTRAVENOUS

## 2020-08-18 MED ORDER — MIDAZOLAM HCL 2 MG/2ML IJ SOLN
INTRAMUSCULAR | Status: AC
  Filled 2020-08-18: qty 2

## 2020-08-18 MED ORDER — FENTANYL CITRATE (PF) 100 MCG/2ML IJ SOLN
INTRAMUSCULAR | Status: DC | PRN
  Administered 2020-08-18: 100 ug via INTRAVENOUS

## 2020-08-18 MED ORDER — PROPOFOL 10 MG/ML IV BOLUS
INTRAVENOUS | Status: AC
  Filled 2020-08-18: qty 20

## 2020-08-18 MED ORDER — CEFAZOLIN SODIUM-DEXTROSE 2-4 GM/100ML-% IV SOLN
2.0000 g | INTRAVENOUS | Status: AC
  Administered 2020-08-18: 2 g via INTRAVENOUS

## 2020-08-18 MED ORDER — CHLORHEXIDINE GLUCONATE CLOTH 2 % EX PADS: 6.0000 | MEDICATED_PAD | Freq: Once | CUTANEOUS | Status: DC

## 2020-08-18 MED ORDER — ONDANSETRON HCL 4 MG/2ML IJ SOLN
INTRAMUSCULAR | Status: DC | PRN
  Administered 2020-08-18: 4 mg via INTRAVENOUS

## 2020-08-18 MED ORDER — BUPIVACAINE LIPOSOME 1.3 % IJ SUSP
INTRAMUSCULAR | Status: AC
  Filled 2020-08-18: qty 10

## 2020-08-18 MED ORDER — FENTANYL CITRATE (PF) 100 MCG/2ML IJ SOLN: 25.0000 ug | INTRAMUSCULAR | Status: DC | PRN

## 2020-08-18 MED ORDER — LACTATED RINGERS IV SOLN: INTRAVENOUS | Status: DC

## 2020-08-18 MED ORDER — FENTANYL CITRATE (PF) 100 MCG/2ML IJ SOLN
INTRAMUSCULAR | Status: AC
  Filled 2020-08-18: qty 2

## 2020-08-18 MED ORDER — PROPOFOL 10 MG/ML IV BOLUS
INTRAVENOUS | Status: DC | PRN
  Administered 2020-08-18: 200 mg via INTRAVENOUS

## 2020-08-18 MED ORDER — DEXAMETHASONE SODIUM PHOSPHATE 10 MG/ML IJ SOLN
INTRAMUSCULAR | Status: DC | PRN
  Administered 2020-08-18: 10 mg via INTRAVENOUS

## 2020-08-18 MED ORDER — CEFAZOLIN SODIUM-DEXTROSE 2-4 GM/100ML-% IV SOLN
INTRAVENOUS | Status: AC
  Filled 2020-08-18: qty 100

## 2020-08-18 MED ORDER — ONDANSETRON HCL 4 MG/2ML IJ SOLN: 4.0000 mg | Freq: Once | INTRAMUSCULAR | Status: DC | PRN

## 2020-08-18 SURGICAL SUPPLY — 39 items
ADH SKN CLS APL DERMABOND .7 (GAUZE/BANDAGES/DRESSINGS) ×1
APL PRP STRL LF DISP 70% ISPRP (MISCELLANEOUS) ×1
BLADE SURG SZ11 CARB STEEL (BLADE) ×2 IMPLANT
CHLORAPREP W/TINT 26 (MISCELLANEOUS) ×2 IMPLANT
CLOTH BEACON ORANGE TIMEOUT ST (SAFETY) ×2 IMPLANT
COVER LIGHT HANDLE STERIS (MISCELLANEOUS) ×4 IMPLANT
COVER WAND RF STERILE (DRAPES) ×2 IMPLANT
DERMABOND ADVANCED (GAUZE/BANDAGES/DRESSINGS) ×1
DERMABOND ADVANCED .7 DNX12 (GAUZE/BANDAGES/DRESSINGS) ×1 IMPLANT
ELECT REM PT RETURN 9FT ADLT (ELECTROSURGICAL) ×2
ELECTRODE REM PT RTRN 9FT ADLT (ELECTROSURGICAL) ×1 IMPLANT
GLOVE SURG SS PI 7.5 STRL IVOR (GLOVE) ×2 IMPLANT
GLOVE SURG UNDER POLY LF SZ7 (GLOVE) ×4 IMPLANT
GOWN STRL REUS W/TWL LRG LVL3 (GOWN DISPOSABLE) ×6 IMPLANT
INST SET MAJOR GENERAL (KITS) IMPLANT
INST SET MINOR GENERAL (KITS) ×2 IMPLANT
KIT TURNOVER KIT A (KITS) ×2 IMPLANT
LIGASURE IMPACT 36 18CM CVD LR (INSTRUMENTS) ×2 IMPLANT
MANIFOLD NEPTUNE II (INSTRUMENTS) ×2 IMPLANT
MESH VENTRALEX ST 1-7/10 CRC S (Mesh General) ×2 IMPLANT
NEEDLE HYPO 21X1.5 SAFETY (NEEDLE) ×2 IMPLANT
NS IRRIG 1000ML POUR BTL (IV SOLUTION) ×2 IMPLANT
PACK MAJOR ABDOMINAL (CUSTOM PROCEDURE TRAY) IMPLANT
PACK MINOR (CUSTOM PROCEDURE TRAY) ×2 IMPLANT
PAD ARMBOARD 7.5X6 YLW CONV (MISCELLANEOUS) ×2 IMPLANT
PENCIL SMOKE EVACUATOR (MISCELLANEOUS) ×2 IMPLANT
SET BASIN LINEN APH (SET/KITS/TRAYS/PACK) ×2 IMPLANT
SUT ETHIBOND 0 MO6 C/R (SUTURE) ×2 IMPLANT
SUT MNCRL AB 4-0 PS2 18 (SUTURE) ×2 IMPLANT
SUT NOVA NAB GS-22 2 2-0 T-19 (SUTURE) IMPLANT
SUT PROLENE 0 CT 1 CR/8 (SUTURE) IMPLANT
SUT SILK 2 0 (SUTURE)
SUT SILK 2-0 18XBRD TIE 12 (SUTURE) IMPLANT
SUT VIC AB 2-0 CT1 27 (SUTURE) ×2
SUT VIC AB 2-0 CT1 TAPERPNT 27 (SUTURE) ×1 IMPLANT
SUT VIC AB 3-0 SH 27 (SUTURE) ×2
SUT VIC AB 3-0 SH 27X BRD (SUTURE) ×1 IMPLANT
SUT VICRYL AB 2 0 TIES (SUTURE) IMPLANT
SYR 20ML LL LF (SYRINGE) ×4 IMPLANT

## 2020-08-18 NOTE — Anesthesia Procedure Notes (Signed)
Procedure Name: LMA Insertion Date/Time: 08/18/2020 9:46 AM Performed by: Shanon Payor, CRNA Pre-anesthesia Checklist: Patient identified, Emergency Drugs available, Suction available, Patient being monitored and Timeout performed Patient Re-evaluated:Patient Re-evaluated prior to induction Oxygen Delivery Method: Circle system utilized Preoxygenation: Pre-oxygenation with 100% oxygen Induction Type: IV induction LMA: LMA inserted LMA Size: 5.0 Number of attempts: 1 Placement Confirmation: positive ETCO2 and breath sounds checked- equal and bilateral Dental Injury: Teeth and Oropharynx as per pre-operative assessment

## 2020-08-18 NOTE — Interval H&P Note (Signed)
History and Physical Interval Note:  08/18/2020 9:22 AM  Steven Padilla  has presented today for surgery, with the diagnosis of Incisional hernia.  The various methods of treatment have been discussed with the patient and family. After consideration of risks, benefits and other options for treatment, the patient has consented to  Procedure(s) with comments: HERNIA REPAIR INCISIONAL W/MESH (N/A) - pt knows to arrive at 8:30 as a surgical intervention.  The patient's history has been reviewed, patient examined, no change in status, stable for surgery.  I have reviewed the patient's chart and labs.  Questions were answered to the patient's satisfaction.     Franky Macho

## 2020-08-18 NOTE — Discharge Instructions (Signed)
Open Hernia Repair, Adult, Care After The following information offers guidance on how to care for yourself after your procedure. Your health care provider may also give you more specific instructions. If you have problems or questions, contact your health care provider. What can I expect after the procedure? After the procedure, it is common to have:  Mild discomfort.  Slight bruising.  Minor swelling.  Pain in the abdomen.  A small amount of blood from the incision. Follow these instructions at home: Medicines  Take over-the-counter and prescription medicines only as told by your health care provider.  Ask your health care provider if the medicine prescribed to you: ? Requires you to avoid driving or using machinery. ? Can cause constipation. You may need to take these actions to prevent or treat constipation:  Drink enough fluid to keep your urine pale yellow.  Take over-the-counter or prescription medicines.  Eat foods that are high in fiber, such as beans, whole grains, and fresh fruits and vegetables.  Limit foods that are high in fat and processed sugars, such as fried or sweet foods.  If you were prescribed an antibiotic medicine, take it as told by your health care provider. Do not stop using the antibiotic even if you start to feel better. Incision care  Follow instructions from your health care provider about how to take care of your incision. Make sure you: ? Wash your hands with soap and water for at least 20 seconds before and after you change your bandage (dressing). If soap and water are not available, use hand sanitizer. ? Change your dressing as told by your health care provider. ? Leave stitches (sutures), skin glue, or adhesive strips in place. These skin closures may need to stay in place for 2 weeks or longer. If adhesive strip edges start to loosen and curl up, you may trim the loose edges. Do not remove adhesive strips completely unless your health care  provider tells you to do that.  Check your incision area every day for signs of infection. Check for: ? More redness, swelling, or pain. ? More fluid or blood. ? Warmth. ? Pus or a bad smell.  Wear loose, soft clothing while your incision heals.   Activity  Rest as told by your health care provider.  Do not lift anything that is heavier than 10 lb (4.5 kg), or the limit that you are told, until your health care provider says that it is safe.  Do not play contact sports until your health care provider says that this is safe.  If you were given a sedative during the procedure, it can affect you for several hours. Do not drive or operate machinery until your health care provider says that it is safe.  Return to your normal activities as told by your health care provider. Ask your health care provider what activities are safe for you.   General instructions  Do not take baths, swim, or use a hot tub until your health care provider approves. Ask your health care provider if you may take showers. You may only be allowed to take sponge baths.  Hold a pillow over your abdomen when you cough or sneeze. This helps with pain.  Do not use any products that contain nicotine or tobacco. These products include cigarettes, chewing tobacco, and vaping devices, such as e-cigarettes. If you need help quitting, ask your health care provider.  Keep all follow-up visits. This is important. Contact a health care provider if:  You  have any of these signs of infection: ? More redness, swelling, or pain around your incision. ? More fluid or blood coming from your incision. ? Warmth coming from your incision. ? Pus or a bad smell coming from your incision. ? A fever or chills.  You have blood in your stool (feces).  You have not had a bowel movement in 2-3 days.  Your pain is not controlled with medicine. Get help right away if:  You have chest pain or shortness of breath.  You feel faint or  light-headed.  You have severe pain.  You vomit and your pain is worse.  You have pain, swelling, or redness in a leg. These symptoms may represent a serious problem that is an emergency. Do not wait to see if the symptoms will go away. Get medical help right away. Call your local emergency services (911 in the [place] Do not drive yourself to the hospital. Summary  After an open hernia repair, it is common to have mild discomfort, slight bruising, and minor swelling.  Follow instructions from your health care provider about how to take care of your incision. Check every day for signs of infection.  Do not lift heavy objects or play contact sports until your health care provider says it is safe.  Return to your normal activities as told by your health care provider. Ask your health care provider what activities are safe for you. This information is not intended to replace advice given to you by your health care provider. Make sure you discuss any questions you have with your health care provider. Document Revised: 01/26/2020 Document Reviewed: 01/26/2020 Elsevier Patient Education  2021 [employer]   Please wear your teal exparel bracelet until Sunday August 22, 2020, Do not use any additional numbing medications without consulting a physician   Bupivacaine Liposomal Suspension for Injection What is this medicine? BUPIVACAINE LIPOSOMAL (bue PIV a kane LIP oh som al) is an anesthetic. It causes loss of feeling in the skin or other tissues. It is used to prevent and to treat pain from some procedures. This medicine may be used for other purposes; ask your health care provider or pharmacist if you have questions. COMMON BRAND NAME(S): EXPAREL What should I tell my health care provider before I take this medicine? They need to know if you have any of these conditions:  G6PD deficiency  heart disease  kidney disease  liver disease  low blood pressure  lung or breathing  disease, like asthma  an unusual or allergic reaction to bupivacaine, other medicines, foods, dyes, or preservatives  pregnant or trying to get pregnant  breast-feeding How should I use this medicine? This medicine is injected into the affected area. It is given by a health care provider in a hospital or clinic setting. Talk to your health care provider about the use of this medicine in children. While it may be given to children as young as 6 years for selected conditions, precautions do apply. Overdosage: If you think you have taken too much of this medicine contact a poison control center or emergency room at once. NOTE: This medicine is only for you. Do not share this medicine with others. What if I miss a dose? This does not apply. What may interact with this medicine? This medicine may interact with the following medications:  acetaminophen  certain antibiotics like dapsone, nitrofurantoin, aminosalicylic acid, sulfonamides  certain medicines for seizures like phenobarbital, phenytoin, valproic acid  chloroquine  cyclophosphamide  flutamide  hydroxyurea  ifosfamide  metoclopramide  nitric oxide  nitroglycerin  nitroprusside  nitrous oxide  other local anesthetics like lidocaine, pramoxine, tetracaine  primaquine  quinine  rasburicase  sulfasalazine This list may not describe all possible interactions. Give your health care provider a list of all the medicines, herbs, non-prescription drugs, or dietary supplements you use. Also tell them if you smoke, drink alcohol, or use illegal drugs. Some items may interact with your medicine. What should I watch for while using this medicine? Your condition will be monitored carefully while you are receiving this medicine. Be careful to avoid injury while the area is numb, and you are not aware of pain. What side effects may I notice from receiving this medicine? Side effects that you should report to your doctor or  health care professional as soon as possible:  allergic reactions like skin rash, itching or hives, swelling of the face, lips, or tongue  seizures  signs and symptoms of a dangerous change in heartbeat or heart rhythm like chest pain; dizziness; fast, irregular heartbeat; palpitations; feeling faint or lightheaded; falls; breathing problems  signs and symptoms of methemoglobinemia such as pale, gray, or blue colored skin; headache; fast heartbeat; shortness of breath; feeling faint or lightheaded, falls; tiredness Side effects that usually do not require medical attention (report to your doctor or health care professional if they continue or are bothersome):  anxious  back pain  changes in taste  changes in vision  constipation  dizziness  fever  nausea, vomiting This list may not describe all possible side effects. Call your doctor for medical advice about side effects. You may report side effects to FDA at 1-800-FDA-1088. Where should I keep my medicine? This drug is given in a hospital or clinic and will not be stored at home. NOTE: This sheet is a summary. It may not cover all possible information. If you have questions about this medicine, talk to your doctor, pharmacist, or health care provider.  2021 Elsevier/Gold Standard (2019-09-18 12:24:57)    General Anesthesia, Adult, Care After This sheet gives you information about how to care for yourself after your procedure. Your health care provider may also give you more specific instructions. If you have problems or questions, contact your health care provider. What can I expect after the procedure? After the procedure, the following side effects are common:  Pain or discomfort at the IV site.  Nausea.  Vomiting.  Sore throat.  Trouble concentrating.  Feeling cold or chills.  Feeling weak or tired.  Sleepiness and fatigue.  Soreness and body aches. These side effects can affect parts of the body that were  not involved in surgery. Follow these instructions at home: For the time period you were told by your health care provider:  Rest.  Do not participate in activities where you could fall or become injured.  Do not drive or use machinery.  Do not drink alcohol.  Do not take sleeping pills or medicines that cause drowsiness.  Do not make important decisions or sign legal documents.  Do not take care of children on your own.   Eating and drinking  Follow any instructions from your health care provider about eating or drinking restrictions.  When you feel hungry, start by eating small amounts of foods that are soft and easy to digest (bland), such as toast. Gradually return to your regular diet.  Drink enough fluid to keep your urine pale yellow.  If you vomit, rehydrate by drinking water, juice, or  clear broth. General instructions  If you have sleep apnea, surgery and certain medicines can increase your risk for breathing problems. Follow instructions from your health care provider about wearing your sleep device: ? Anytime you are sleeping, including during daytime naps. ? While taking prescription pain medicines, sleeping medicines, or medicines that make you drowsy.  Have a responsible adult stay with you for the time you are told. It is important to have someone help care for you until you are awake and alert.  Return to your normal activities as told by your health care provider. Ask your health care provider what activities are safe for you.  Take over-the-counter and prescription medicines only as told by your health care provider.  If you smoke, do not smoke without supervision.  Keep all follow-up visits as told by your health care provider. This is important. Contact a health care provider if:  You have nausea or vomiting that does not get better with medicine.  You cannot eat or drink without vomiting.  You have pain that does not get better with  medicine.  You are unable to pass urine.  You develop a skin rash.  You have a fever.  You have redness around your IV site that gets worse. Get help right away if:  You have difficulty breathing.  You have chest pain.  You have blood in your urine or stool, or you vomit blood. Summary  After the procedure, it is common to have a sore throat or nausea. It is also common to feel tired.  Have a responsible adult stay with you for the time you are told. It is important to have someone help care for you until you are awake and alert.  When you feel hungry, start by eating small amounts of foods that are soft and easy to digest (bland), such as toast. Gradually return to your regular diet.  Drink enough fluid to keep your urine pale yellow.  Return to your normal activities as told by your health care provider. Ask your health care provider what activities are safe for you. This information is not intended to replace advice given to you by your health care provider. Make sure you discuss any questions you have with your health care provider. Document Revised: 02/26/2020 Document Reviewed: 09/25/2019 Elsevier Patient Education  2021 Elsevier Inc.    Tramadol; Acetaminophen Oral Tablets What is this medicine? TRAMADOL; ACETAMINOPHEN (TRA ma dol; ea set a MEE noe fen) is a pain reliever. It is used to treat moderate pain. This medicine may be used for other purposes; ask your health care provider or pharmacist if you have questions. COMMON BRAND NAME(S): Ultracet What should I tell my health care provider before I take this medicine? They need to know if you have any of these conditions:  brain tumor  drug abuse or addiction  head injury  heart disease  if you often drink alcohol  kidney disease or trouble passing urine  low adrenal gland function  lung disease, asthma, or breathing problems  seizures  stomach or intestine problems  taken an MAOI like Marplan,  Nardil, or Parnate in the last 14 days  an unusual or allergic reaction to tramadol, acetaminophen, codeine, other opioid analgesics, other medicines, foods, dyes, or preservatives  pregnant or trying to get pregnant  breast-feeding How should I use this medicine? Take this medicine by mouth with a full glass of water. Follow the directions on the prescription label. If the medicine upsets your stomach,  take it with food or milk. Do not take your medicine more often than directed. A special MedGuide will be given to you by the pharmacist with each prescription and refill. Be sure to read this information carefully each time. Talk to your pediatrician regarding the use of this medicine in children. Special care may be needed. This medicine is not for use in children less than 72 years of age. Do not give this medicine to a child younger than 72 years of age after surgery to remove the tonsils and/or adenoids. Overdosage: If you think you have taken too much of this medicine contact a poison control center or emergency room at once. NOTE: This medicine is only for you. Do not share this medicine with others. What if I miss a dose? If you miss a dose, take it as soon as you can. If it is almost time for your next dose, take only that dose. Do not take double or extra doses. What may interact with this medicine? Do not take this medication with any of the following medicines:  MAOIs like Carbex, Eldepryl, Marplan, Nardil, and Parnate This medicine may also interact with the following medications:  alcohol  antihistamines for allergy, cough and cold  certain medicines for anxiety or sleep  certain medicines for depression like amitriptyline, fluoxetine, sertraline  certain medicines for migraine headache like almotriptan, eletriptan, frovatriptan, naratriptan, rizatriptan, sumatriptan, zolmitriptan  certain medicines for seizures like carbamazepine, oxcarbazepine, phenobarbital,  primidone  certain medicines that treat or prevent blood clots like warfarin  digoxin  furazolidone  general anesthetics like halothane, isoflurane, methoxyflurane, propofol  linezolid  local anesthetics like lidocaine, pramoxine, tetracaine  medicines that relax muscles for surgery  other narcotic medicines for pain or cough  phenothiazines like chlorpromazine, mesoridazine, prochlorperazine, thioridazine  procarbazine This list may not describe all possible interactions. Give your health care provider a list of all the medicines, herbs, non-prescription drugs, or dietary supplements you use. Also tell them if you smoke, drink alcohol, or use illegal drugs. Some items may interact with your medicine. What should I watch for while using this medicine? Tell your health care provider if your pain does not go away, if it gets worse, or if you have new or a different type of pain. You may develop tolerance to this drug. Tolerance means that you will need a higher dose of the drug for pain relief. Tolerance is normal and is expected if you take this drug for a long time. Do not suddenly stop taking your drug because you may develop a severe reaction. Your body becomes used to the drug. This does NOT mean you are addicted. Addiction is a behavior related to getting and using a drug for a nonmedical reason. If you have pain, you have a medical reason to take pain drug. Your health care provider will tell you how much drug to take. If your health care provider wants you to stop the drug, the dose will be slowly lowered over time to avoid any side effects. If you take other drugs that also cause drowsiness like other narcotic pain drugs, benzodiazepines, or other drugs for sleep, you may have more side effects. Give your health care provider a list of all drugs you use. He or she will tell you how much drug to take. Do not take more drug than directed. Get emergency help right away if you have  trouble breathing or are unusually tired or sleepy. Talk to your health care provider about naloxone  and how to get it. Naloxone is an emergency drug used for an opioid overdose. An overdose can happen if you take too much opioid. It can also happen if an opioid is taken with some other drugs or substances, like alcohol. Know the symptoms of an overdose, like trouble breathing, unusually tired or sleepy, or not being able to respond or wake up. Make sure to tell caregivers and close contacts where it is stored. Make sure they know how to use it. After naloxone is given, you must get emergency help right away. Naloxone is a temporary treatment. Repeat doses may be needed. Do not take other drugs that contain acetaminophen with this drug. Many non-prescription drugs contain acetaminophen. Always read labels carefully. If you have questions, ask your health care provider. If you take too much acetaminophen, get medical help right away. Too much acetaminophen can be very dangerous and cause liver damage. Even if you do not have symptoms, it is important to get help right away. This drug may cause serious skin reactions. They can happen weeks to months after starting the drug. Contact your health care provider right away if you notice fevers or flu-like symptoms with a rash. The rash may be red or purple and then turn into blisters or peeling of the skin. Or, you might notice a red rash with swelling of the face, lips or lymph nodes in your neck or under your arms. You may get drowsy or dizzy. Do not drive, use machinery, or do anything that needs mental alertness until you know how this drug affects you. Do not stand up or sit up quickly, especially if you are an older patient. This reduces the risk of dizzy or fainting spells. Alcohol may interfere with the effect of this drug. Avoid alcoholic drinks. This drug will cause constipation. If you do not have a bowel movement for 3 days, call your health care  provider. Your mouth may get dry. Chewing sugarless gum or sucking hard candy and drinking plenty of water may help. Contact your health care provider if the problem does not go away or is severe. What side effects may I notice from receiving this medicine? Side effects that you should report to your doctor or health care professional as soon as possible:  allergic reactions like skin rash, itching or hives, swelling of the face, lips, or tongue  confusion  kidney injury (trouble passing urine or change in the amount of urine)  low adrenal gland function (nausea; vomiting; loss of appetite; unusually weak or tired; dizziness; low blood pressure)  low blood pressure (dizziness; feeling faint or lightheaded, falls; unusually weak or tired)  redness, blistering, peeling or loosening of the skin, including inside the mouth  seizures  serotonin syndrome (irritable; confusion; diarrhea; fast or irregular heartbeat; muscle twitching; stiff muscles; trouble walking; sweating; high fever; seizures; chills; vomiting)  trouble breathing  yellowing of the eyes or skin Side effects that usually do not require medical attention (report to your doctor or health care professional if they continue or are bothersome):  constipation  dry mouth  nausea, vomiting  tiredness This list may not describe all possible side effects. Call your doctor for medical advice about side effects. You may report side effects to FDA at 1-800-FDA-1088. Where should I keep my medicine? Keep out of the reach of children. Tramadol is a morphine-like drug that can be abused. Keep your medicine in a safe place to protect it from theft. Do not share this medicine with  anyone. Selling or giving away this medicine is dangerous and is against the law. This medicine may cause accidental overdose and death if it taken by other adults, children, or pets. Mix any unused medicine with a substance like cat litter or coffee grounds.  Then throw the medicine away in a sealed container like a sealed bag or a coffee can with a lid. Do not use the medicine after the expiration date. Store at room temperature between 15 and 30 degrees C (59 and 86 degrees F). NOTE: This sheet is a summary. It may not cover all possible information. If you have questions about this medicine, talk to your doctor, pharmacist, or health care provider.  2021 Elsevier/Gold Standard (2019-05-01 16:52:31)

## 2020-08-18 NOTE — Op Note (Signed)
Patient:  Steven Padilla  DOB:  09-28-1950  MRN:  [id number]   Preop Diagnosis: Incisional hernia  Postop Diagnosis: Same  Procedure: Incisional herniorrhaphy with mesh  Surgeon: Franky Macho, MD  Assistant: Algis Greenhouse, MD  Anes: General endotracheal  Indications: Patient is a 70 year old white male status post laparoscopic cholecystectomy in the remote past who presented to the emergency room with an incarcerated incisional hernia.  This was reduced in the emergency room.  The patient now comes to the operating room for an incisional herniorrhaphy with mesh.  The risks and benefits of the procedure including bleeding, infection, mesh use, the possibility of recurrence of the hernia were fully explained to the patient, who gave informed consent.  Procedure note: The patient was placed in supine position.  After induction of general endotracheal anesthesia, the abdomen was prepped and draped using the usual sterile technique with ChloraPrep.  Surgical site confirmation was performed.  A midline incision was made just above the umbilicus.  The dissection was taken down to the fascia.  A small 1 cm hernia defect was found with omentum in the hernia sac.  The hernia sac was excised down to the fascia.  The omentum that was in the hernia sac was excised using the LigaSure.  This allowed me to reduce the contents into the abdominal cavity.  The ventral wall was then palpated and no other hernia defects were noted.  A 4.7 cm Bard Ventralax ST patch was then inserted and secured to the fascia using 0 Ethibond interrupted sutures.  The overlying fascia was reapproximated transversely using 0 Ethibond interrupted sutures.  The subcutaneous layer was reapproximated using a 2-0 Vicryl interrupted suture.  Exparel was instilled into the surrounding wound.  The skin was closed using a 4-0 Monocryl subcuticular suture.  Dermabond was applied.  All tape and needle counts were correct at the end of the  procedure.  The patient was extubated in the operating room and transferred to PACU in stable condition.  Complications: None  EBL: Minimal  Specimen: None

## 2020-08-18 NOTE — Anesthesia Postprocedure Evaluation (Signed)
Anesthesia Post Note  Patient: Steven Padilla  Procedure(s) Performed: HERNIA REPAIR INCISIONAL W/MESH (N/A Abdomen)  Patient location during evaluation: PACU Anesthesia Type: General Level of consciousness: awake, oriented, awake and alert and patient cooperative Pain management: satisfactory to patient Vital Signs Assessment: post-procedure vital signs reviewed and stable Respiratory status: spontaneous breathing, respiratory function stable and nonlabored ventilation Cardiovascular status: stable Postop Assessment: no apparent nausea or vomiting Anesthetic complications: no   No complications documented.   Last Vitals:  Vitals:   08/18/20 0900  BP: 134/74  Pulse: 65  Resp: (!) 30  Temp: 36.8 C  SpO2: 98%    Last Pain:  Vitals:   08/18/20 0900  PainSc: 0-No pain                 [name]

## 2020-08-18 NOTE — Anesthesia Preprocedure Evaluation (Signed)
Anesthesia Evaluation  Patient identified by MRN, date of birth, ID band Patient awake    Reviewed: Allergy & Precautions, H&P , NPO status , Patient's Chart, lab work & pertinent test results, reviewed documented beta blocker date and time   Airway Mallampati: II  TM Distance: >3 FB Neck ROM: full    Dental no notable dental hx. (+) Teeth Intact   Pulmonary neg pulmonary ROS, former smoker,    Pulmonary exam normal breath sounds clear to auscultation       Cardiovascular Exercise Tolerance: Good hypertension, negative cardio ROS   Rhythm:regular Rate:Normal     Neuro/Psych negative neurological ROS  negative psych ROS   GI/Hepatic negative GI ROS, Neg liver ROS,   Endo/Other  negative endocrine ROS  Renal/GU CRFRenal disease  negative genitourinary   Musculoskeletal   Abdominal   Peds  Hematology negative hematology ROS (+)   Anesthesia Other Findings   Reproductive/Obstetrics negative OB ROS                             Anesthesia Physical Anesthesia Plan  ASA: III  Anesthesia Plan: General   Post-op Pain Management:    Induction:   PONV Risk Score and Plan: Ondansetron  Airway Management Planned:   Additional Equipment:   Intra-op Plan:   Post-operative Plan:   Informed Consent: I have reviewed the patients History and Physical, chart, labs and discussed the procedure including the risks, benefits and alternatives for the proposed anesthesia with the patient or authorized representative who has indicated his/her understanding and acceptance.     Dental Advisory Given  Plan Discussed with: CRNA  Anesthesia Plan Comments:         Anesthesia Quick Evaluation

## 2020-08-18 NOTE — Transfer of Care (Signed)
Immediate Anesthesia Transfer of Care Note  Patient: Steven Padilla  Procedure(s) Performed: HERNIA REPAIR INCISIONAL W/MESH (N/A Abdomen)  Patient Location: PACU  Anesthesia Type:General  Level of Consciousness: awake, alert , oriented and patient cooperative  Airway & Oxygen Therapy: Patient Spontanous Breathing  Post-op Assessment: Report given to RN, Post -op Vital signs reviewed and stable and Patient moving all extremities X 4  Post vital signs: Reviewed and stable  Last Vitals:  Vitals Value Taken Time  BP    Temp    Pulse    Resp    SpO2      Last Pain:  Vitals:   08/18/20 0900  PainSc: 0-No pain         Complications: No complications documented.

## 2020-08-19 ENCOUNTER — Encounter (HOSPITAL_COMMUNITY): Payer: Self-pay | Admitting: General Surgery

## 2020-08-27 ENCOUNTER — Encounter: Payer: Self-pay | Admitting: General Surgery

## 2020-08-27 ENCOUNTER — Ambulatory Visit (INDEPENDENT_AMBULATORY_CARE_PROVIDER_SITE_OTHER): Payer: Medicare Other | Admitting: General Surgery

## 2020-08-27 DIAGNOSIS — Z09 Encounter for follow-up examination after completed treatment for conditions other than malignant neoplasm: Secondary | ICD-10-CM

## 2020-08-27 NOTE — Progress Notes (Signed)
Subjective:     Steven Padilla  Virtual telephone postoperative visit performed.  Patient was on his cell phone and I was in the office.  He states he is doing well.  He has had minimal incisional pain.  He is pleased with the results. Objective:    There were no vitals taken for this visit.  General:   Telephone visit       Assessment:    Doing well postoperatively.    Plan:   May increase his activity as able.  Follow-up here as needed.  Total telephone time was 2 minutes.  As this was a postoperative visit, there was a part of the global surgical fee and is not billable.

## 2020-10-13 DIAGNOSIS — R3912 Poor urinary stream: Secondary | ICD-10-CM | POA: Diagnosis not present

## 2020-10-13 DIAGNOSIS — N401 Enlarged prostate with lower urinary tract symptoms: Secondary | ICD-10-CM | POA: Diagnosis not present

## 2020-10-13 DIAGNOSIS — Z125 Encounter for screening for malignant neoplasm of prostate: Secondary | ICD-10-CM | POA: Diagnosis not present

## 2020-10-13 DIAGNOSIS — R3915 Urgency of urination: Secondary | ICD-10-CM | POA: Diagnosis not present

## 2020-11-03 DIAGNOSIS — U071 COVID-19: Secondary | ICD-10-CM | POA: Diagnosis not present

## 2020-12-01 ENCOUNTER — Ambulatory Visit (INDEPENDENT_AMBULATORY_CARE_PROVIDER_SITE_OTHER): Payer: Medicare Other | Admitting: Neurology

## 2020-12-01 ENCOUNTER — Encounter: Payer: Self-pay | Admitting: Neurology

## 2020-12-01 VITALS — BP 140/70 | HR 58 | Ht 76.0 in | Wt 251.0 lb

## 2020-12-01 DIAGNOSIS — R202 Paresthesia of skin: Secondary | ICD-10-CM

## 2020-12-01 DIAGNOSIS — R55 Syncope and collapse: Secondary | ICD-10-CM

## 2020-12-01 NOTE — Progress Notes (Signed)
Chief Complaint  Patient presents with  . Follow-up    Rm 15, alone, states he is doing well,     HISTORICAL  Steven Padilla is a 70 year old male, seen in request by his primary care physician Dr. Felipa Eth, Joylene Draft for evaluation of dizziness.  I reviewed and summarized the referring note. PMHX. Chronic kidney disease HTN HLD  I saw him in September 2020 for similar but more severe episode.  Steven Padilla is a 70 years old male, seen in request by his primary care physician Dr. Felipa Eth, Joylene Draft for evaluation of blacking out spells, initial evaluation was on March 06, 2019.  He is the owner of apple orchard, works out side regularly, in the middle of August 2020, while working on the storage room,  he stood on the bench, felt sudden onset bilateral blurred vision, he was able to get down, holding on the scaffold by the site, lasting 10 to 15 seconds, then his vision came back, while in a standing position, he had a second episode bilateral vision went black, again lasted about 10 seconds, he was able to sit down, had a third episode, he denied loss of consciousness, no chest pain, heart palpitation,  He has been taking aspirin 81 mg daily,  Reviewed CT angiogram of February 19, 2019, normal appearance of brain, and her carotid artherosclerotic disease, 40% stenosis on the right side, less than 20% stenosis on the left side; 50 to 70% stenosis of right vertebral artery,  He is wearing 30 days cardiac monitoring, no arrhythmia noted.  ECHO in Sept 2020: EF 68%, normal global wall motion.  I personally reviewed MRI brain on Apr 02 2019 showed no acute abnormalities, enlargement of optic nerve sheaths,    Oct 2021, LD 78, Cholesterol 136, CP 132.  He did not have recurrent severe episode as described above, but he has occasionally lightheaded dizziness sensation when he moves suddenly, such as bending over, standing up quickly, mild strong sensation, lasting for few seconds,  then he can quickly establish equilibrium  He also complains of gradual onset bilateral feet paresthesia since 2016, he does keep good water intake  Blood pressure lying down 125/65, 57, sitting, 134/70, 63; standing 116/67, 74; standing for 3 minutes 116/69, 73  Update December 01, 2020 SS: Here today alone,  Recaps his total of 3 spells, within 15-30 seconds apart, August 2020, had episode of vision blacking out, but he remained aware, 2 steps on the ladder. Within 2-5 minutes everything went back to normal. Sees cardiology, Dr. Rosemary Holms. Moderate right vertebral artery stenosis on CTA in August 2020 50-70%. Continue aspirin, Lipitor or Zetia. Owns apple orchard, still Sports coach. No further spells. Taking Flomax at bedtime. Stand slowly now, careful with position changes. Is very active.  In December 2021 extensive laboratory evaluation (TSH, CRP, HIV, folate, RPR, B12, CK, sed rate, A1c, ANA, vitamin D, CBC, MM panel, CMP) were unremarkable with exception of elevated creatinine 1.6.  REVIEW OF SYSTEMS: Full 14 system review of systems performed and notable only for as above  See HPI  ALLERGIES: Allergies  Allergen Reactions  . Codeine Anxiety  . Flexeril [Cyclobenzaprine]     "knocks me out too fast"    HOME MEDICATIONS: Current Outpatient Medications  Medication Sig Dispense Refill  . aspirin EC 81 MG tablet Take 1 tablet by mouth daily.    Marland Kitchen atorvastatin (LIPITOR) 10 MG tablet Take 1 tablet (10 mg total) by mouth daily. 90 tablet 3  .  ezetimibe (ZETIA) 10 MG tablet Take 1 tablet (10 mg total) by mouth daily. 90 tablet 3  . fluticasone (FLONASE) 50 MCG/ACT nasal spray Place into both nostrils daily as needed for allergies.    Marland Kitchen irbesartan (AVAPRO) 300 MG tablet Take 300 mg by mouth daily.    . tamsulosin (FLOMAX) 0.4 MG CAPS capsule Take 0.4 mg by mouth daily after breakfast.    . traMADol (ULTRAM) 50 MG tablet Take 1 tablet (50 mg total) by mouth every 6 (six) hours as  needed. 25 tablet 0   No current facility-administered medications for this visit.    PAST MEDICAL HISTORY: Past Medical History:  Diagnosis Date  . Bone spur   . BPH with elevated PSA   . CKD (chronic kidney disease), stage III (HCC)   . Detached retina, left   . Hyperlipemia   . Hypertension   . Syncope   . Vision disturbance     PAST SURGICAL HISTORY: Past Surgical History:  Procedure Laterality Date  . CHOLECYSTECTOMY    . INCISIONAL HERNIA REPAIR N/A 08/18/2020   Procedure: HERNIA REPAIR INCISIONAL W/MESH;  Surgeon: Franky Macho, MD;  Location: [facility];  Service: General;  Laterality: N/A;  pt knows to arrive at 8:30  . lt knee    . rt shoulder      FAMILY HISTORY: Family History  Problem Relation Age of Onset  . Hypertension Mother   . Hypertension Father   . Hypertension Brother   . Aneurysm Brother     SOCIAL HISTORY: Social History   Socioeconomic History  . Marital status: Married    Spouse name: Not on file  . Number of children: 2  . Years of education: some college  . Highest education level: Not on file  Occupational History  . Occupation: owns apple orchard  . Occupation: retired Engineer, structural  Tobacco Use  . Smoking status: Former Smoker    Packs/day: 2.00    Years: 20.00    Pack years: 40.00    Types: Cigarettes, Pipe, Cigars    Quit date: 1983    Years since quitting: 39.4  . Smokeless tobacco: Former Neurosurgeon    Types: Engineer, drilling  . Vaping Use: Never used  Substance and Sexual Activity  . Alcohol use: Not Currently  . Drug use: Not Currently  . Sexual activity: Yes  Other Topics Concern  . Not on file  Social History Narrative   Lives at home with his wife.   Ambidextrous.   Caffeine use:  One cup per day.   Social Determinants of Health   Financial Resource Strain: Not on file  Food Insecurity: Not on file  Transportation Needs: Not on file  Physical Activity: Not on file  Stress: Not on file  Social Connections: Not on  file  Intimate Partner Violence: Not on file   PHYSICAL EXAM   Vitals:   12/01/20 0931  BP: 140/70  Pulse: (!) 58  Weight: 251 lb (113.9 kg)  Height: 6\' 4"  (1.93 m)   Not recorded     Body mass index is 30.55 kg/m.  Physical Exam  General: The patient is alert and cooperative at the time of the examination.  Skin: No significant peripheral edema is noted.  Neurologic Exam  Mental status: The patient is alert and oriented x 3 at the time of the examination. The patient has apparent normal recent and remote memory, with an apparently normal attention span and concentration ability.  Cranial nerves:  Facial symmetry is present. Speech is normal, no aphasia or dysarthria is noted. Extraocular movements are full. Visual fields are full.  Motor: The patient has good strength in all 4 extremities.  Sensory examination: Soft touch sensation is symmetric on the face, arms, and legs.  Coordination: The patient has good finger-nose-finger and heel-to-shin bilaterally.  Gait and station: The patient has a normal gait.  Reflexes: Deep tendon reflexes are symmetric.  DIAGNOSTIC DATA (LABS, IMAGING, TESTING) - I reviewed patient records, labs, notes, testing and imaging myself where available.  ASSESSMENT AND PLAN  Steven Padilla is a 70 y.o. male   1. Bilateral lower extremity paresthesia 2. Positional related dizziness, vision changes  -No further spells since August 2020, he is doing well  -Extensive laboratory evaluation (TSH, CRP, HIV, folate, RPR, B12, CK, sed rate, A1c, ANA, vitamin D, CBC, MM panel, CMP) were unremarkable with exception of elevated creatinine 1.6. -Felt could be orthostatic blood pressure changes contributing to symptoms, small fiber neuropathy? -Encouraged to increase water intake -CT angiogram in August 2020 showed 50 to 70% stenosis of the right vertebral artery, at next visit we will revisit if we should repeat, report didn't feel degree of vascular  disease would contribute to syncopal spells, as mentioned no further spells have occurred -On aspirin, Lipitor, Zetia -Follow-up in 6 months or sooner if needed  Margie Ege, Edrick Oh, DNP  [facility] [address], [address] [place], [place] [address] [phone]

## 2020-12-01 NOTE — Patient Instructions (Signed)
Follow-up in 6 months, will revisit checking the right vertebral artery at next visit Continue current medications

## 2021-01-17 DIAGNOSIS — E785 Hyperlipidemia, unspecified: Secondary | ICD-10-CM | POA: Diagnosis not present

## 2021-01-17 DIAGNOSIS — Z125 Encounter for screening for malignant neoplasm of prostate: Secondary | ICD-10-CM | POA: Diagnosis not present

## 2021-01-24 DIAGNOSIS — I129 Hypertensive chronic kidney disease with stage 1 through stage 4 chronic kidney disease, or unspecified chronic kidney disease: Secondary | ICD-10-CM | POA: Diagnosis not present

## 2021-01-24 DIAGNOSIS — E669 Obesity, unspecified: Secondary | ICD-10-CM | POA: Diagnosis not present

## 2021-01-24 DIAGNOSIS — D696 Thrombocytopenia, unspecified: Secondary | ICD-10-CM | POA: Diagnosis not present

## 2021-01-24 DIAGNOSIS — N1832 Chronic kidney disease, stage 3b: Secondary | ICD-10-CM | POA: Diagnosis not present

## 2021-01-24 DIAGNOSIS — Z0000 Encounter for general adult medical examination without abnormal findings: Secondary | ICD-10-CM | POA: Diagnosis not present

## 2021-01-24 DIAGNOSIS — N400 Enlarged prostate without lower urinary tract symptoms: Secondary | ICD-10-CM | POA: Diagnosis not present

## 2021-01-24 DIAGNOSIS — M25551 Pain in right hip: Secondary | ICD-10-CM | POA: Diagnosis not present

## 2021-01-24 DIAGNOSIS — E785 Hyperlipidemia, unspecified: Secondary | ICD-10-CM | POA: Diagnosis not present

## 2021-01-24 DIAGNOSIS — I6501 Occlusion and stenosis of right vertebral artery: Secondary | ICD-10-CM | POA: Diagnosis not present

## 2021-01-24 DIAGNOSIS — R82998 Other abnormal findings in urine: Secondary | ICD-10-CM | POA: Diagnosis not present

## 2021-01-24 DIAGNOSIS — M1990 Unspecified osteoarthritis, unspecified site: Secondary | ICD-10-CM | POA: Diagnosis not present

## 2021-03-09 NOTE — Progress Notes (Signed)
Chart reviewed, agree above plan ?

## 2021-04-13 ENCOUNTER — Encounter: Payer: Self-pay | Admitting: Cardiology

## 2021-04-13 ENCOUNTER — Ambulatory Visit: Payer: Medicare Other | Admitting: Cardiology

## 2021-04-13 ENCOUNTER — Other Ambulatory Visit: Payer: Self-pay

## 2021-04-13 VITALS — BP 125/74 | HR 52 | Temp 97.8°F | Resp 16 | Ht 76.0 in | Wt 257.0 lb

## 2021-04-13 DIAGNOSIS — I6501 Occlusion and stenosis of right vertebral artery: Secondary | ICD-10-CM

## 2021-04-13 DIAGNOSIS — E782 Mixed hyperlipidemia: Secondary | ICD-10-CM

## 2021-04-13 DIAGNOSIS — R0989 Other specified symptoms and signs involving the circulatory and respiratory systems: Secondary | ICD-10-CM | POA: Insufficient documentation

## 2021-04-13 DIAGNOSIS — I10 Essential (primary) hypertension: Secondary | ICD-10-CM

## 2021-04-13 MED ORDER — ATORVASTATIN CALCIUM 10 MG PO TABS: 10.0000 mg | ORAL_TABLET | Freq: Every day | ORAL | 3 refills | Status: DC

## 2021-04-13 MED ORDER — EZETIMIBE 10 MG PO TABS: 10.0000 mg | ORAL_TABLET | Freq: Every day | ORAL | 3 refills | Status: DC

## 2021-04-13 NOTE — Progress Notes (Signed)
Patient referred by [name], MD for presyncope  Subjective:   Steven Padilla, male    DOB: 02/24/1951, 70 y.o.   MRN: [id number]   Chief Complaint  Patient presents with   Hypertension   Hyperlipidemia   Follow-up    1 year   HPI  70 y.o. Caucasian male with hypertension, hyperlipidemia, Rt vertebral artery stenosis, family h/o brain aneurysm, h/o presyncope.  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  He manually picks apples from the trees, walks a lot throughout the day.  He denies any chest pain, shortness of breath symptoms.  However, he does note episodes of dizziness, typically when he looks up to clock apples from the trees.  Sometimes, he also experiences dizziness when he stands up from sitting position.  He does have chronic neck pain.  He was found to have a moderate right vertebral artery stenosis on CT angiogram in 01/2019.  He has been intolerant to higher doses of statin.  Therefore, he is on Lipitor 10 mg and Zetia 10 mg daily.    Patient is here for follow-up.  He is doing well, denies any cardiac complaints.  He continues to work regularly and his apple orchard gets plenty of walking without any symptoms of chest pain or shortness of breath.  Blood pressure slightly elevated today, improved on further check.  At home, it is usually very well controlled.  No recent lipid panel available for my review.   Current Outpatient Medications on File Prior to Visit  Medication Sig Dispense Refill   aspirin EC 81 MG tablet Take 1 tablet by mouth daily.     atorvastatin (LIPITOR) 10 MG tablet Take 1 tablet (10 mg total) by mouth daily. 90 tablet 3   ezetimibe (ZETIA) 10 MG tablet Take 1 tablet (10 mg total) by mouth daily. 90 tablet 3   fluticasone (FLONASE) 50 MCG/ACT nasal spray Place into both nostrils daily as needed for allergies.     irbesartan (AVAPRO) 300 MG tablet Take 300 mg by mouth daily.     traMADol (ULTRAM) 50 MG tablet  Take 1 tablet (50 mg total) by mouth every 6 (six) hours as needed. 25 tablet 0   tamsulosin (FLOMAX) 0.4 MG CAPS capsule Take 0.4 mg by mouth daily after breakfast.     No current facility-administered medications on file prior to visit.    Cardiovascular studies:  EKG 04/13/2021: Sinus rhythm 59 bpm Low voltage Old inferior infarct   Event monitor 02/21/2019 - 03/22/2019: Dominant rhythm sinus. HR 38-120 bpm. One episode of skipped beats correlated with normal sinus rhythm. No arrhythmia noted.   Echocardiogram 03/12/2019: Left ventricle cavity is normal in size. Mild concentric hypertrophy of the left ventricle. Normal LV systolic function with EF 68%. Normal global wall motion. Doppler evidence of grade I (impaired) diastolic dysfunction, normal LAP.  Left atrial cavity is mildly dilated. Aneurysmal interatrial septum without 2D or color Doppler evidence of interatrial shunt. Trileaflet aortic valve. Mild aortic valve leaflet calcification. Mild (Grade I) aortic regurgitation. Mild pulmonic regurgitation. No evidence  CTA head/neck 02/19/2019: Normal appearance of the brain itself. Aortic atherosclerosis.  Atherosclerotic disease at both carotid bifurcation and ICA bulb regions but without stenosis. Atherosclerotic plaque of both common carotid arteries, 40% stenosis on the right. Less than 20% stenosis on the left. Atherosclerotic disease in both carotid siphon regions. 40% stenosis of the right supraclinoid ICA. No stenosis on the left greater than 20%.  No stenosis or occlusion of the anterior circulation branch vessels. 50-70% stenosis of the right vertebral artery origin but wide patency beyond that to the basilar. Left vertebral artery arises from the arch, without stenosis and is patent to the basilar. I do not see vascular disease of the degree that I would expect to result in syncopal episodes.  Recent labs: 08/09/2020: Glucose 135, BUN/Cr 27/1.64. EGFR 44. Na/K  139/4. Rest of the CMP normal H/H 13/41. MCV 95. Platelets 149 HbA1C 5.6% TSH 0.7 normal  04/08/2020: Chol 136, TG 76, HDL 43, LDL 78  10/09/2019: Chol 147, TG 69, HDL 43, LDL 90  06/12/2019: Chol 151, TG 87, HDL 43, LDL 91  11/15/2018: Glucose 105. BUN/Cr 27/1.5. eGFR 56. Na/K 142/4.8. H/H 13.8/95. MCV 95. Platelets 125.  Chol 204, TG 67, HDL 43, LDL 148   Review of Systems  Cardiovascular:  Negative for chest pain, dyspnea on exertion, leg swelling, palpitations and syncope.  Musculoskeletal:  Positive for joint pain.  Neurological:  Positive for dizziness (Occasional, but looking up).       Vitals:   04/13/21 0821  BP: (!) 152/78  Pulse: 60  Resp: 16  Temp: 97.8 F (36.6 C)  SpO2: 97%     Objective:   Physical Exam Vitals and nursing note reviewed.  Constitutional:      Appearance: He is well-developed.  Neck:     Vascular: No JVD.  Cardiovascular:     Rate and Rhythm: Normal rate and regular rhythm.     Pulses: Intact distal pulses.     Heart sounds: Normal heart sounds. No murmur heard. Pulmonary:     Effort: Pulmonary effort is normal.     Breath sounds: Normal breath sounds. No wheezing or rales.  Musculoskeletal:     Right lower leg: No edema.     Left lower leg: No edema.         Assessment & Recommendations:   70 y.o. Caucasian male with hypertension, hyperlipidemia, family h/o brain aneurysm, h/o presyncope.  Dizziness: Continues to have occasional episodes, but no syncope. He has seen neurology in 11/2020 for this. He does have a right vertebral artery stenosis, that they plan to reimage at some point. Today, patient mentions that he remains concerned about his family history of brain aneurysm in his brother.  I will defer to neurology, if and when he should have any surveillance monitoring to look for intracranial aneurysm. In the meantime, I will obtain carotid duplex given right carotid bruit. Continue aspirin, Lipitor, Zetia.  Will  obtain lipid panel results from PCP.  Hypertension: Generally well controlled.  No change made today.  F/u in 1 year  Nigel Mormon, MD [facility] Pager: [phone] Office: [phone] If no answer Cell [phone]

## 2021-05-24 DIAGNOSIS — M25551 Pain in right hip: Secondary | ICD-10-CM | POA: Diagnosis not present

## 2021-05-24 DIAGNOSIS — M25552 Pain in left hip: Secondary | ICD-10-CM | POA: Diagnosis not present

## 2021-06-08 NOTE — Progress Notes (Signed)
Chief Complaint  Patient presents with   Follow-up    RM 8 alone  Pt is well, still having some dizzy spells but also have sinus issues which has helped since weather has changed.  No blacking out or falls since last visit     HISTORICAL  Steven Padilla is a 70 year old male, seen in request by his primary care physician Dr. Felipa Eth, Joylene Draft for evaluation of dizziness.  I reviewed and summarized the referring note. PMHX. Chronic kidney disease HTN HLD  I saw him in September 2020 for similar but more severe episode.  Steven Padilla is a 70 years old male, seen in request by his primary care physician Dr. Felipa Eth, Joylene Draft for evaluation of blacking out spells, initial evaluation was on March 06, 2019.  He is the owner of apple orchard, works out side regularly, in the middle of August 2020, while working on the storage room,  he stood on the bench, felt sudden onset bilateral blurred vision, he was able to get down, holding on the scaffold by the site, lasting 10 to 15 seconds, then his vision came back, while in a standing position, he had a second episode bilateral vision went black, again lasted about 10 seconds, he was able to sit down, had a third episode, he denied loss of consciousness, no chest pain, heart palpitation,   He has been taking aspirin 81 mg daily,  Reviewed CT angiogram of February 19, 2019, normal appearance of brain, and her carotid artherosclerotic disease, 40% stenosis on the right side, less than 20% stenosis on the left side; 50 to 70% stenosis of right vertebral artery,   He is wearing 30 days cardiac monitoring, no arrhythmia noted.  ECHO in Sept 2020: EF 68%, normal global wall motion.   I personally reviewed MRI brain on Apr 02 2019 showed no acute abnormalities, enlargement of optic nerve sheaths,    Oct 2021, LD 78, Cholesterol 136, CP 132.  He did not have recurrent severe episode as described above, but he has occasionally lightheaded  dizziness sensation when he moves suddenly, such as bending over, standing up quickly, mild strong sensation, lasting for few seconds, then he can quickly establish equilibrium  He also complains of gradual onset bilateral feet paresthesia since 2016, he does keep good water intake  Blood pressure lying down 125/65, 57, sitting, 134/70, 63; standing 116/67, 74; standing for 3 minutes 116/69, 73  Update December 01, 2020 SS: Here today alone,  Recaps his total of 3 spells, within 15-30 seconds apart, August 2020, had episode of vision blacking out, but he remained aware, 2 steps on the ladder. Within 2-5 minutes everything went back to normal. Sees cardiology, Dr. Rosemary Holms. Moderate right vertebral artery stenosis on CTA in August 2020 50-70%. Continue aspirin, Lipitor or Zetia. Owns apple orchard, still Sports coach. No further spells. Taking Flomax at bedtime. Stand slowly now, careful with position changes. Is very active.  In December 2021 extensive laboratory evaluation (TSH, CRP, HIV, folate, RPR, B12, CK, sed rate, A1c, ANA, vitamin D, CBC, MM panel, CMP) were unremarkable with exception of elevated creatinine 1.6.  Update June 09, 2021 SS: [facility] cardiology October 2022, ordered carotid duplex given right carotid bruit (not having until beginning of year). No further spells since initial in August 2020, some dizziness if standing too quickly. On aspirin, Lipitor, Zetia. Doing well, creatinine was 1.64 Feb 2022, has been baseline. Left knee pain since getting tripped while hunting.   REVIEW OF  SYSTEMS: Full 14 system review of systems performed and notable only for as above  See HPI  ALLERGIES: Allergies  Allergen Reactions   Codeine Anxiety   Flexeril [Cyclobenzaprine]     "knocks me out too fast"    HOME MEDICATIONS: Current Outpatient Medications  Medication Sig Dispense Refill   aspirin EC 81 MG tablet Take 1 tablet by mouth daily.     atorvastatin (LIPITOR) 10 MG tablet  Take 1 tablet (10 mg total) by mouth daily. 90 tablet 3   ezetimibe (ZETIA) 10 MG tablet Take 1 tablet (10 mg total) by mouth daily. 90 tablet 3   fluticasone (FLONASE) 50 MCG/ACT nasal spray Place into both nostrils daily as needed for allergies.     irbesartan (AVAPRO) 300 MG tablet Take 300 mg by mouth daily.     tamsulosin (FLOMAX) 0.4 MG CAPS capsule Take 0.4 mg by mouth daily after breakfast.     traMADol (ULTRAM) 50 MG tablet Take 1 tablet (50 mg total) by mouth every 6 (six) hours as needed. 25 tablet 0   No current facility-administered medications for this visit.    PAST MEDICAL HISTORY: Past Medical History:  Diagnosis Date   Bone spur    BPH with elevated PSA    CKD (chronic kidney disease), stage III (HCC)    Detached retina, left    Hyperlipemia    Hypertension    Syncope    Vision disturbance     PAST SURGICAL HISTORY: Past Surgical History:  Procedure Laterality Date   CHOLECYSTECTOMY     INCISIONAL HERNIA REPAIR N/A 08/18/2020   Procedure: HERNIA REPAIR INCISIONAL W/MESH;  Surgeon: Aviva Signs, MD;  Location: [facility];  Service: General;  Laterality: N/A;  pt knows to arrive at 8:30   lt knee     rt shoulder      FAMILY HISTORY: Family History  Problem Relation Age of Onset   Hypertension Mother    Hypertension Father    Hypertension Brother    Aneurysm Brother     SOCIAL HISTORY: Social History   Socioeconomic History   Marital status: Married    Spouse name: Not on file   Number of children: 2   Years of education: some college   Highest education level: Not on file  Occupational History   Occupation: owns apple orchard   Occupation: retired Arts administrator  Tobacco Use   Smoking status: Former    Packs/day: 2.00    Years: 20.00    Pack years: 40.00    Types: Cigarettes, Pipe, Cigars    Quit date: 1983    Years since quitting: 39.9   Smokeless tobacco: Former    Types: Nurse, children's Use: Never used  Substance and Sexual  Activity   Alcohol use: Not Currently   Drug use: Not Currently   Sexual activity: Yes  Other Topics Concern   Not on file  Social History Narrative   Lives at home with his wife.   Ambidextrous.   Caffeine use:  One cup per day.   Social Determinants of Health   Financial Resource Strain: Not on file  Food Insecurity: Not on file  Transportation Needs: Not on file  Physical Activity: Not on file  Stress: Not on file  Social Connections: Not on file  Intimate Partner Violence: Not on file   PHYSICAL EXAM   Vitals:   06/09/21 0847  BP: (!) 154/76  Pulse: (!) 55  Weight: 255 lb (  115.7 kg)  Height: 6\' 4"  (1.93 m)    Not recorded     Body mass index is 31.04 kg/m.  Physical Exam  General: The patient is alert and cooperative at the time of the examination.  Skin: No significant peripheral edema is noted.  Neurologic Exam  Mental status: The patient is alert and oriented x 3 at the time of the examination. The patient has apparent normal recent and remote memory, with an apparently normal attention span and concentration ability.  Cranial nerves: Facial symmetry is present. Speech is normal, no aphasia or dysarthria is noted. Extraocular movements are full. Visual fields are full.  Motor: The patient has good strength in all 4 extremities.  Sensory examination: Soft touch sensation is symmetric on the face, arms, and legs.  Coordination: The patient has good finger-nose-finger and heel-to-shin bilaterally.  Gait and station: The patient has a normal gait.  DIAGNOSTIC DATA (LABS, IMAGING, TESTING) - I reviewed patient records, labs, notes, testing and imaging myself where available.  ASSESSMENT AND PLAN  Steven Padilla is a 69 y.o. male    1. Bilateral lower extremity paresthesia 2. Positional related dizziness, vision changes 3. Stenosis of right vertebral artery   -Doing well, no further significant dizzy spells since August 2020 -Discussed repeating  CT angiogram of neck showing 50-70% stenosis (Aug 2020) of the right vertebral artery, will hold off due to poor kidney function, baseline creatinine 1.6; encouraged to continue management of vascular risk factors, already on aspirin, Zetia, Lipitor, of note the report indicated it was not expected that the degree of vascular disease would result in syncopal episodes -CT angiogram of the head in Aug 2020, was normal, no aneurysm -Encouraged him to undergo right carotid ultrasound ordered by cardiology for right carotid bruit -Extensive laboratory evaluation (TSH, CRP, HIV, folate, RPR, B12, CK, sed rate, A1c, ANA, vitamin D, CBC, MM panel, CMP) were unremarkable with exception of elevated creatinine 1.6. -Felt could be orthostatic blood pressure changes contributing to symptoms? Small fiber neuropathy? -We will follow-up in 1 year or sooner if needed with Dr. Krista Blue, should let us know of any further spells, keep follow-up with cardiology  CT angiogram head and neck with and without contrast Aug 2020 IMPRESSION: Normal appearance of the brain itself.   Aortic atherosclerosis.   Atherosclerotic disease at both carotid bifurcation and ICA bulb regions but without stenosis. Atherosclerotic plaque of both common carotid arteries, 40% stenosis on the right. Less than 20% stenosis on the left.   Atherosclerotic disease in both carotid siphon regions. 40% stenosis of the right supraclinoid ICA. No stenosis on the left greater than 20%.   No stenosis or occlusion of the anterior circulation branch vessels.   50-70% stenosis of the right vertebral artery origin but wide patency beyond that to the basilar. Left vertebral artery arises from the arch, without stenosis and is patent to the basilar.   I do not see vascular disease of the degree that I would expect to result in syncopal episodes.   Evangeline Dakin, DNP  [facility] [address], [place], [place]  [id number] [phone]

## 2021-06-09 ENCOUNTER — Ambulatory Visit (INDEPENDENT_AMBULATORY_CARE_PROVIDER_SITE_OTHER): Payer: Medicare Other | Admitting: Neurology

## 2021-06-09 ENCOUNTER — Other Ambulatory Visit: Payer: Self-pay

## 2021-06-09 ENCOUNTER — Encounter: Payer: Self-pay | Admitting: Neurology

## 2021-06-09 VITALS — BP 154/76 | HR 55 | Ht 76.0 in | Wt 255.0 lb

## 2021-06-09 DIAGNOSIS — R55 Syncope and collapse: Secondary | ICD-10-CM | POA: Diagnosis not present

## 2021-06-09 DIAGNOSIS — I6501 Occlusion and stenosis of right vertebral artery: Secondary | ICD-10-CM | POA: Diagnosis not present

## 2021-06-09 NOTE — Progress Notes (Signed)
Chart reviewed, agree above plan ?

## 2021-06-09 NOTE — Patient Instructions (Signed)
Will hold off on imaging for now  Continue aspirin, Zetia, Lipitor  Get your ultrasound from the cardiologist beginning of the year See you back in 1 year

## 2021-07-13 ENCOUNTER — Other Ambulatory Visit: Payer: Self-pay

## 2021-07-13 ENCOUNTER — Ambulatory Visit: Payer: Medicare PPO

## 2021-07-13 DIAGNOSIS — R0989 Other specified symptoms and signs involving the circulatory and respiratory systems: Secondary | ICD-10-CM

## 2021-07-13 DIAGNOSIS — I6523 Occlusion and stenosis of bilateral carotid arteries: Secondary | ICD-10-CM

## 2021-07-14 ENCOUNTER — Other Ambulatory Visit: Payer: Medicare PPO

## 2021-07-22 NOTE — Progress Notes (Signed)
Discussed results. Stable carotid duplex. Needs f/u lipid panel. Will check it with PCP Dr Felipa Eth, has appt with him net week.  Elder Negus, MD

## 2021-11-01 DIAGNOSIS — M25551 Pain in right hip: Secondary | ICD-10-CM | POA: Diagnosis not present

## 2021-11-02 ENCOUNTER — Other Ambulatory Visit: Payer: Self-pay | Admitting: Family Medicine

## 2021-11-02 ENCOUNTER — Other Ambulatory Visit (HOSPITAL_COMMUNITY): Payer: Self-pay | Admitting: Family Medicine

## 2021-11-02 DIAGNOSIS — M25551 Pain in right hip: Secondary | ICD-10-CM

## 2021-11-15 ENCOUNTER — Ambulatory Visit (HOSPITAL_COMMUNITY)
Admission: RE | Admit: 2021-11-15 | Discharge: 2021-11-15 | Disposition: A | Discharge status: Home / Self Care | Payer: Medicare PPO | Source: Ambulatory Visit | Attending: Family Medicine | Admitting: Family Medicine

## 2021-11-15 DIAGNOSIS — M25551 Pain in right hip: Secondary | ICD-10-CM | POA: Diagnosis not present

## 2021-11-15 DIAGNOSIS — M1611 Unilateral primary osteoarthritis, right hip: Secondary | ICD-10-CM | POA: Diagnosis not present

## 2021-11-28 DIAGNOSIS — M25551 Pain in right hip: Secondary | ICD-10-CM | POA: Diagnosis not present

## 2021-11-28 DIAGNOSIS — M25552 Pain in left hip: Secondary | ICD-10-CM | POA: Diagnosis not present

## 2022-02-08 DIAGNOSIS — N1832 Chronic kidney disease, stage 3b: Secondary | ICD-10-CM | POA: Diagnosis not present

## 2022-02-08 DIAGNOSIS — Z0000 Encounter for general adult medical examination without abnormal findings: Secondary | ICD-10-CM | POA: Diagnosis not present

## 2022-02-08 DIAGNOSIS — I6501 Occlusion and stenosis of right vertebral artery: Secondary | ICD-10-CM | POA: Diagnosis not present

## 2022-02-08 DIAGNOSIS — M1990 Unspecified osteoarthritis, unspecified site: Secondary | ICD-10-CM | POA: Diagnosis not present

## 2022-02-08 DIAGNOSIS — D696 Thrombocytopenia, unspecified: Secondary | ICD-10-CM | POA: Diagnosis not present

## 2022-02-08 DIAGNOSIS — N400 Enlarged prostate without lower urinary tract symptoms: Secondary | ICD-10-CM | POA: Diagnosis not present

## 2022-02-08 DIAGNOSIS — E669 Obesity, unspecified: Secondary | ICD-10-CM | POA: Diagnosis not present

## 2022-02-08 DIAGNOSIS — I129 Hypertensive chronic kidney disease with stage 1 through stage 4 chronic kidney disease, or unspecified chronic kidney disease: Secondary | ICD-10-CM | POA: Diagnosis not present

## 2022-02-08 DIAGNOSIS — E785 Hyperlipidemia, unspecified: Secondary | ICD-10-CM | POA: Diagnosis not present

## 2022-02-08 DIAGNOSIS — R82998 Other abnormal findings in urine: Secondary | ICD-10-CM | POA: Diagnosis not present

## 2022-02-09 DIAGNOSIS — E785 Hyperlipidemia, unspecified: Secondary | ICD-10-CM | POA: Diagnosis not present

## 2022-02-09 DIAGNOSIS — I129 Hypertensive chronic kidney disease with stage 1 through stage 4 chronic kidney disease, or unspecified chronic kidney disease: Secondary | ICD-10-CM | POA: Diagnosis not present

## 2022-02-09 DIAGNOSIS — E669 Obesity, unspecified: Secondary | ICD-10-CM | POA: Diagnosis not present

## 2022-02-09 DIAGNOSIS — N1832 Chronic kidney disease, stage 3b: Secondary | ICD-10-CM | POA: Diagnosis not present

## 2022-02-09 DIAGNOSIS — I10 Essential (primary) hypertension: Secondary | ICD-10-CM | POA: Diagnosis not present

## 2022-04-13 ENCOUNTER — Ambulatory Visit: Payer: Medicare PPO | Admitting: Cardiology

## 2022-05-08 ENCOUNTER — Other Ambulatory Visit: Payer: Self-pay

## 2022-05-08 DIAGNOSIS — E782 Mixed hyperlipidemia: Secondary | ICD-10-CM

## 2022-05-08 MED ORDER — ATORVASTATIN CALCIUM 10 MG PO TABS: 10.0000 mg | ORAL_TABLET | Freq: Every day | ORAL | 3 refills | Status: DC

## 2022-05-08 MED ORDER — EZETIMIBE 10 MG PO TABS: 10.0000 mg | ORAL_TABLET | Freq: Every day | ORAL | 3 refills | Status: DC

## 2022-07-24 ENCOUNTER — Encounter: Payer: Self-pay | Admitting: Neurology

## 2022-07-24 ENCOUNTER — Ambulatory Visit: Payer: Medicare PPO | Admitting: Neurology

## 2022-07-24 VITALS — BP 139/71 | HR 57 | Ht 76.6 in | Wt 255.0 lb

## 2022-07-24 DIAGNOSIS — M79604 Pain in right leg: Secondary | ICD-10-CM | POA: Diagnosis not present

## 2022-07-24 DIAGNOSIS — R55 Syncope and collapse: Secondary | ICD-10-CM | POA: Diagnosis not present

## 2022-07-24 DIAGNOSIS — M545 Low back pain, unspecified: Secondary | ICD-10-CM

## 2022-07-24 NOTE — Progress Notes (Unsigned)
ASSESSMENT AND PLAN  Steven Padilla is a 72 y.o. male    Episode of dizziness, Stenosis of right vertebral artery,  Have no recurrent similar spells since 2020,  Overall stable, doing well,  Continue follow-up with his primary care physician on return to clinic for new issues       DIAGNOSTIC DATA (LABS, IMAGING, TESTING) - I reviewed patient records, labs, notes, testing and imaging myself where available.   HISTORICAL  Steven Padilla is a 72 year old male, seen in request by his primary care physician Dr. Dagmar Hait, Steva Ready for evaluation of dizziness.  I reviewed and summarized the referring note. PMHX. Chronic kidney disease HTN HLD  I saw him in September 2020 for similar but more severe episode.  Steven Padilla is a 72 years old male, seen in request by his primary care physician Dr. Dagmar Hait, Steva Ready for evaluation of blacking out spells, initial evaluation was on March 06, 2019.  He is the owner of apple orchard, works out side regularly, in the middle of August 2020, while working on the storage room,  he stood on the bench, felt sudden onset bilateral blurred vision, he was able to get down, holding on the scaffold by the site, lasting 10 to 15 seconds, then his vision came back, while in a standing position, he had a second episode bilateral vision went black, again lasted about 10 seconds, he was able to sit down, had a third episode, he denied loss of consciousness, no chest pain, heart palpitation,   He has been taking aspirin 81 mg daily,  Reviewed CT angiogram of February 19, 2019, normal appearance of brain, and her carotid artherosclerotic disease, 40% stenosis on the right side, less than 20% stenosis on the left side; 50 to 70% stenosis of right vertebral artery,   He is wearing 30 days cardiac monitoring, no arrhythmia noted.  ECHO in Sept 2020: EF 68%, normal global wall motion.   I personally reviewed MRI brain on Apr 02 2019 showed no acute  abnormalities, enlargement of optic nerve sheaths,    Oct 2021, LD 78, Cholesterol 136, CP 132.  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  MRI of right hip in May 2023, 1. No evidence of fracture trauma dislocation or significant arthropathy of the right hip.   2. Mild insertional tendinopathy of the gluteus medius. Ligaments and tendons are otherwise unremarkable. Muscles and subcutaneous soft tissues are also within normal limits.     REVIEW OF SYSTEMS: Full 14 system review of systems performed and notable only for as above  See HPI  PHYSICAL EXAM   Vitals:   07/24/22 0905  BP: 139/71  Pulse: (!) 57  Weight: 255 lb (115.7 kg)  Height: 6' 4.6" (1.946 m)  Body mass index is 30.56 kg/m.  Physical Exam  General: The patient is alert and cooperative at the time of the examination.  Skin: No significant peripheral edema is noted.  Neurologic Exam  Mental status: The patient is alert and oriented x 3 at the time of the examination. The patient has apparent normal recent and remote memory, with an apparently normal attention span and concentration ability.  Cranial nerves: Facial symmetry is present. Speech is normal,  no aphasia or dysarthria is noted. Extraocular movements are full. Visual fields are full.  Motor: The patient has good strength in all 4 extremities.  Sensory examination: Intact to light touch  Coordination: The patient has good finger-nose-finger and heel-to-shin bilaterally.  Gait and station: Able to get up from seated position arm crossed  ALLERGIES: Allergies  Allergen Reactions   Codeine Anxiety   Flexeril [Cyclobenzaprine]     "knocks me out too fast"    HOME MEDICATIONS: Current Outpatient Medications  Medication Sig Dispense Refill   aspirin EC 81 MG tablet Take 1 tablet by mouth daily.     atorvastatin (LIPITOR) 10 MG tablet Take 1 tablet (10 mg total) by mouth daily. 90 tablet 3   ezetimibe (ZETIA) 10 MG tablet Take 1 tablet (10 mg total) by mouth daily. 90 tablet 3   fluticasone (FLONASE) 50 MCG/ACT nasal spray Place into both nostrils daily as needed for allergies.     irbesartan (AVAPRO) 300 MG tablet Take 300 mg by mouth daily.     tamsulosin (FLOMAX) 0.4 MG CAPS capsule Take 0.4 mg by mouth daily after breakfast.     traMADol (ULTRAM) 50 MG tablet Take 1 tablet (50 mg total) by mouth every 6 (six) hours as needed. 25 tablet 0   No current facility-administered medications for this visit.    PAST MEDICAL HISTORY: Past Medical History:  Diagnosis Date   Bone spur    BPH with elevated PSA    CKD (chronic kidney disease), stage III (HCC)    Detached retina, left    Hyperlipemia    Hypertension    Syncope    Vision disturbance     PAST SURGICAL HISTORY: Past Surgical History:  Procedure Laterality Date   CHOLECYSTECTOMY     INCISIONAL HERNIA REPAIR N/A 08/18/2020   Procedure: HERNIA REPAIR INCISIONAL W/MESH;  Surgeon: Aviva Signs, MD;  Location: [facility];  Service: General;  Laterality: N/A;  pt knows to arrive at 8:30   lt knee     rt shoulder      FAMILY HISTORY: Family History  Problem Relation Age of Onset   Hypertension Mother     Hypertension Father    Hypertension Brother    Aneurysm Brother     SOCIAL HISTORY: Social History   Socioeconomic History   Marital status: Married    Spouse name: Not on file   Number of children: 2   Years of education: some college   Highest education level: Not on file  Occupational History   Occupation: owns apple orchard   Occupation: retired Arts administrator  Tobacco Use   Smoking status: Former    Packs/day: 2.00    Years: 20.00    Total pack years: 40.00    Types: Cigarettes, Pipe, Cigars    Quit date: 1983    Years since quitting: 41.1   Smokeless tobacco: Former    Types: Nurse, children's Use: Never used  Substance and Sexual Activity   Alcohol use: Not  Currently   Drug use: Not Currently   Sexual activity: Yes  Other Topics Concern   Not on file  Social History Narrative   Lives at home with his wife.   Ambidextrous.   Caffeine use:  One cup per day.   Social Determinants of Health   Financial Resource Strain: Not on file  Food Insecurity: Not on file  Transportation Needs: Not on file  Physical Activity: Not on file  Stress: Not on file  Social Connections: Not on file  Intimate Partner Violence: Not on file   Levert Feinstein, M.D. Ph.D.  [facility] [address] [place], [place] [address] Phone: [phone] Fax:      [phone]

## 2022-07-27 DIAGNOSIS — I129 Hypertensive chronic kidney disease with stage 1 through stage 4 chronic kidney disease, or unspecified chronic kidney disease: Secondary | ICD-10-CM | POA: Diagnosis not present

## 2022-07-27 DIAGNOSIS — E669 Obesity, unspecified: Secondary | ICD-10-CM | POA: Diagnosis not present

## 2022-07-27 DIAGNOSIS — I6501 Occlusion and stenosis of right vertebral artery: Secondary | ICD-10-CM | POA: Diagnosis not present

## 2022-07-27 DIAGNOSIS — E785 Hyperlipidemia, unspecified: Secondary | ICD-10-CM | POA: Diagnosis not present

## 2022-07-27 DIAGNOSIS — J309 Allergic rhinitis, unspecified: Secondary | ICD-10-CM | POA: Diagnosis not present

## 2022-07-27 DIAGNOSIS — N1832 Chronic kidney disease, stage 3b: Secondary | ICD-10-CM | POA: Diagnosis not present

## 2022-07-27 DIAGNOSIS — N400 Enlarged prostate without lower urinary tract symptoms: Secondary | ICD-10-CM | POA: Diagnosis not present

## 2022-07-27 DIAGNOSIS — M1990 Unspecified osteoarthritis, unspecified site: Secondary | ICD-10-CM | POA: Diagnosis not present

## 2022-07-27 DIAGNOSIS — D696 Thrombocytopenia, unspecified: Secondary | ICD-10-CM | POA: Diagnosis not present

## 2022-10-05 DIAGNOSIS — S61213A Laceration without foreign body of left middle finger without damage to nail, initial encounter: Secondary | ICD-10-CM | POA: Diagnosis not present

## 2022-10-16 IMAGING — MR MR HIP*R* W/O CM
5 series · 38 of 40 positions shown · non-contrast
Comparison: None Available.

CLINICAL DATA: Right hip pain for 5 months.

EXAM:
MR OF THE RIGHT HIP WITHOUT CONTRAST
TECHNIQUE: Multiplanar, multisequence MR imaging was performed. No intravenous
contrast was administered.

[Series 8: T1 · coronal · right · 4.0mm · 0.86mm/px · 8 of 30 slices shown]
[im 1/30]
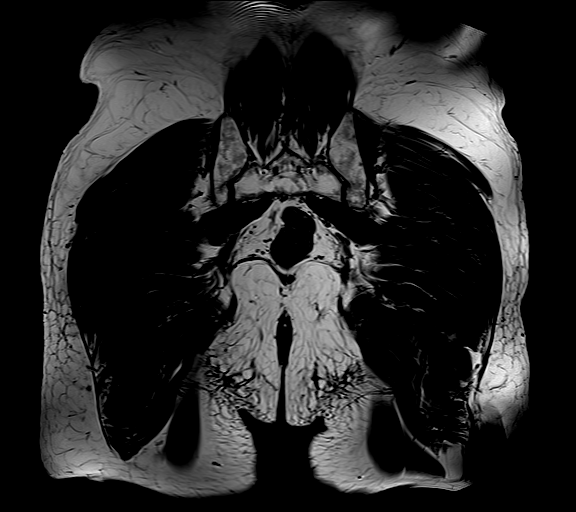
[im 5/30]
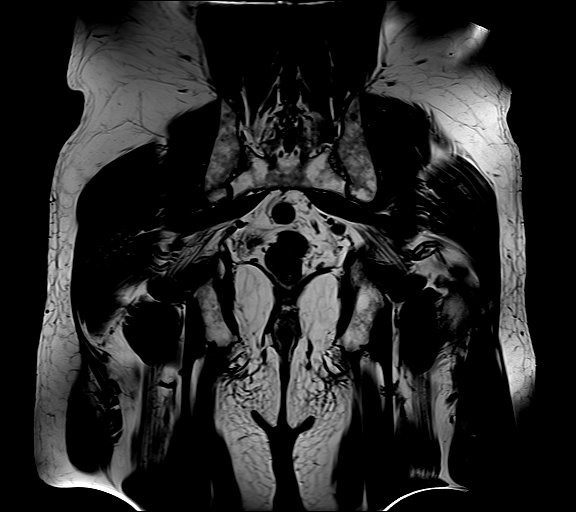
[im 9/30]
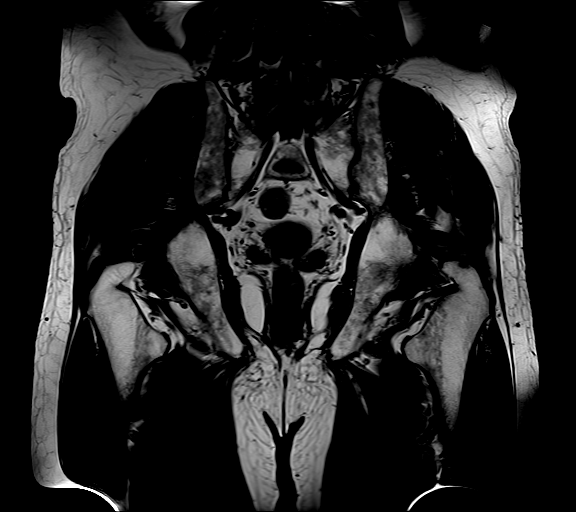
[im 13/30]
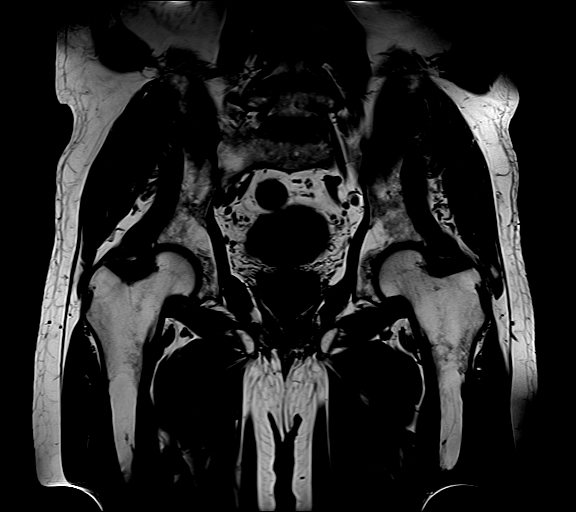
[im 17/30]
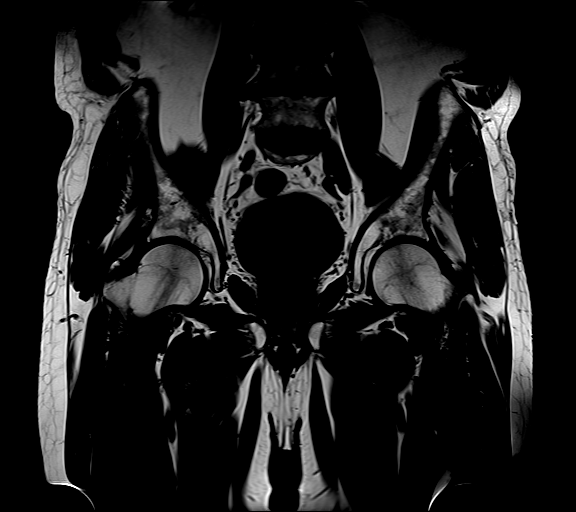
[im 21/30]
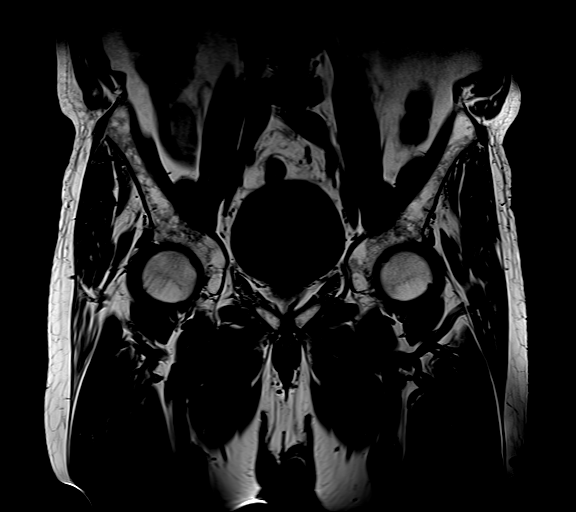
[im 25/30]
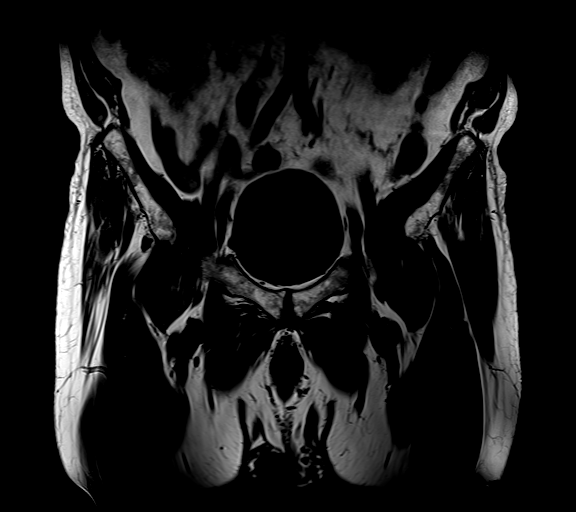
[im 30/30]
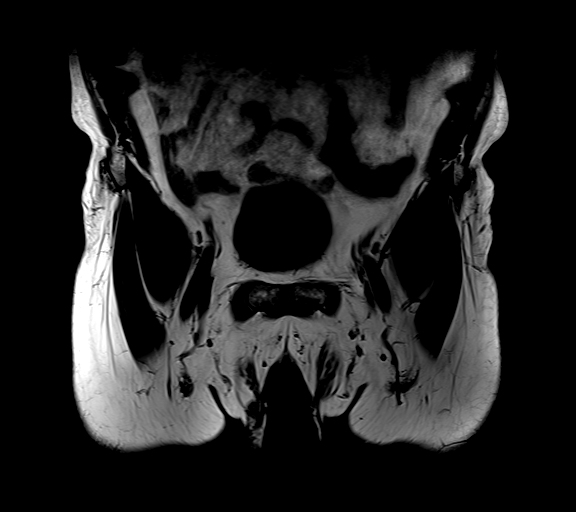

[Series 9: STIR · coronal · right · 4.0mm · 1.15mm/px · 8 of 30 slices shown]
[im 1/30]
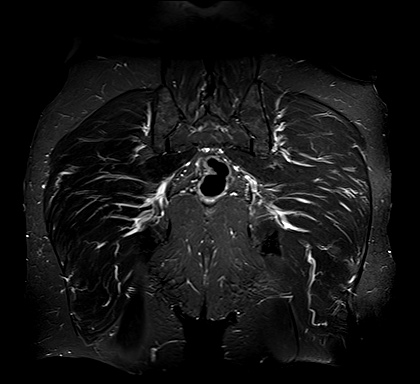
[im 5/30]
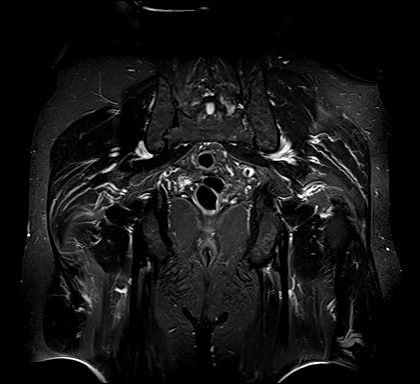
[im 9/30]
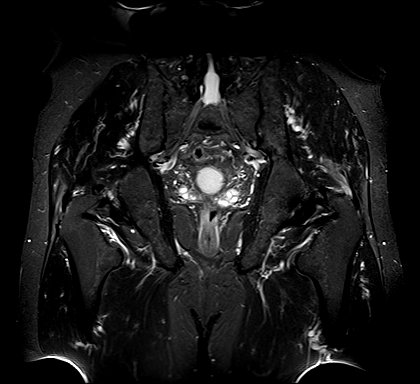
[im 13/30]
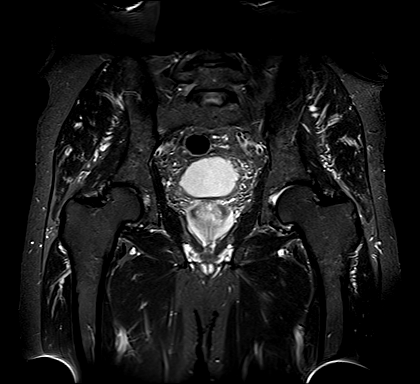
[im 17/30]
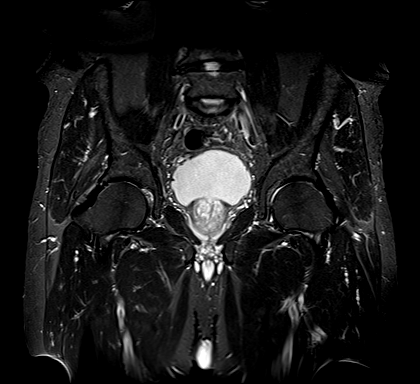
[im 21/30]
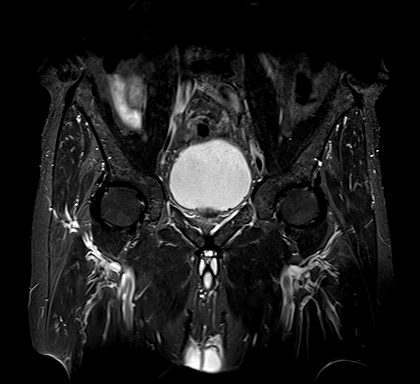
[im 25/30]
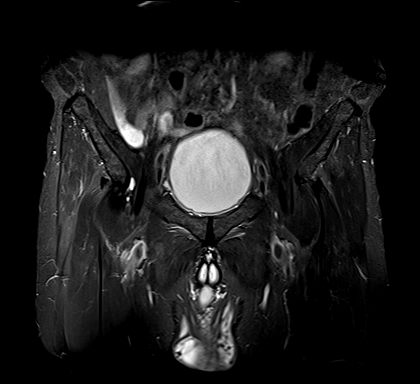
[im 30/30]
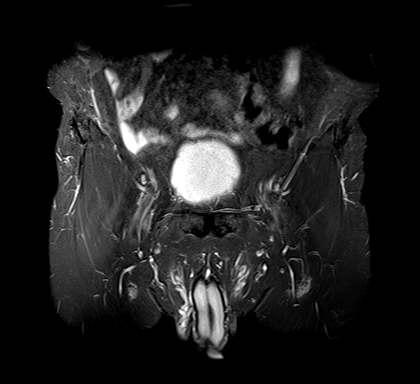

[Series 10: T2 fat-sat · axial · right · 4.0mm · 1.96mm/px · z∈[-113,+57]mm · 8 of 35 slices shown]
[im 1/35]
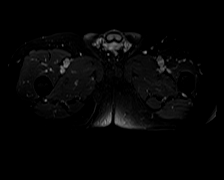
[im 4/35]
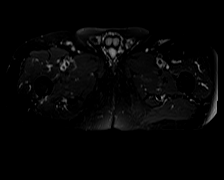
[im 12/35]
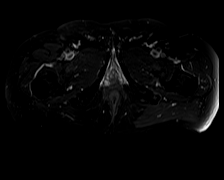
[im 16/35]
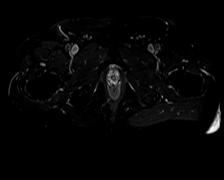
[im 19/35]
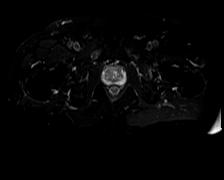
[im 23/35]
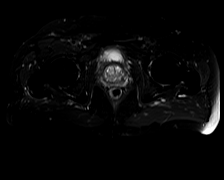
[im 31/35]
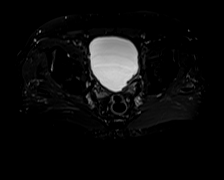
[im 35/35]
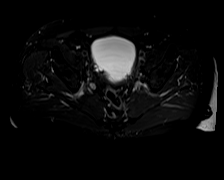

[Series 11: PD fat-sat · sagittal · right · 4.0mm · 0.70mm/px · 8 of 29 slices shown (1 of 2)]
[im 1/29]
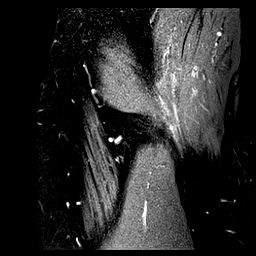
[im 5/29]
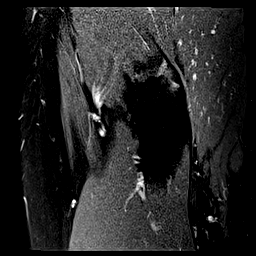
[im 9/29]
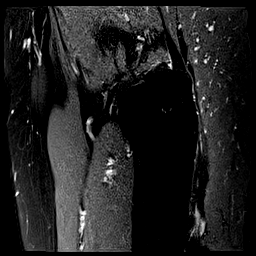
[im 13/29]
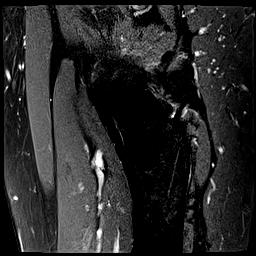
[im 17/29]
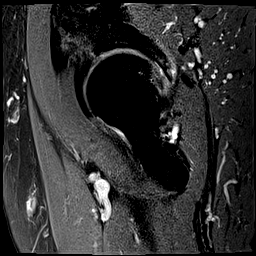
[im 21/29]
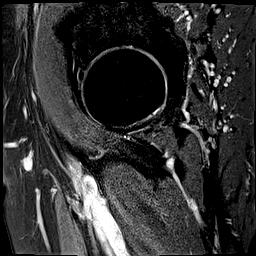
[im 25/29]
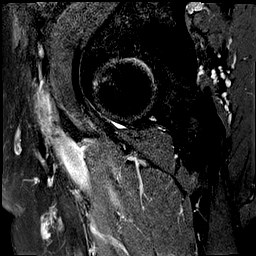
[im 29/29]
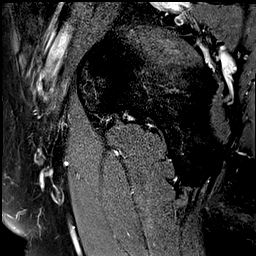

[Series 12: PD fat-sat · coronal · right · 4.0mm · 0.70mm/px · 6 of 20 slices shown (2 of 2)]
[im 1/20]
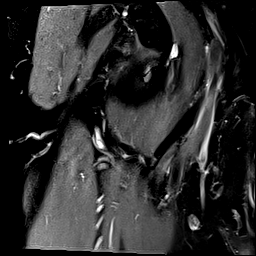
[im 4/20]
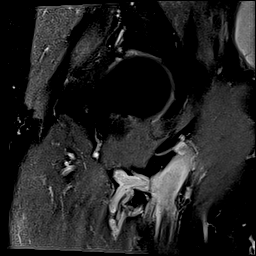
[im 8/20]
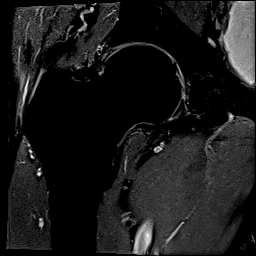
[im 12/20]
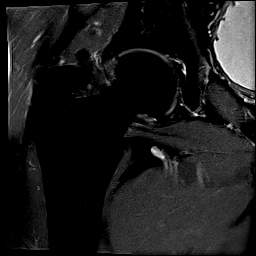
[im 16/20]
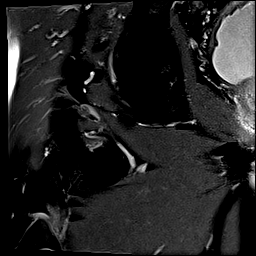
[im 20/20]
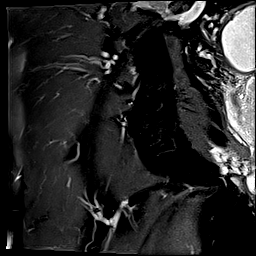

[38 of 40 positions shown; findings below may reference images not displayed]

FINDINGS: Bone

No hip fracture, dislocation or avascular necrosis. Small acetabular
lip and femoral head neck junction marginal osteophytes. No
aggressive osseous lesion.

SI joints are normal. No SI joint widening or erosive changes.

Multilevel DA and disc disease of the lower lumbar spine.

Alignment

Normal. No subluxation.

Dysplasia

None.

Joint effusion

No joint effusions.

Labrum

Generalized degenerative changes of the labrum.

Cartilage

Advanced articular cartilage thinning with

Capsule and ligaments

Normal.

Muscles and Tendons

Flexors: Normal.

Extensors: Normal.

Abductors: Mild tendinopathy at the insertion of the gluteus medius.

Adductors: Normal.

Rotators: Normal.

Hamstrings: Normal.

Other Findings

No bursal fluid.

Viscera

No abnormality seen in pelvis. No lymphadenopathy. No free fluid in
the pelvis.
IMPRESSION: 1. No evidence of fracture trauma dislocation or significant
arthropathy of the right hip.

2. Mild insertional tendinopathy of the gluteus medius. Ligaments
and tendons are otherwise unremarkable. Muscles and subcutaneous
soft tissues are also within normal limits.

## 2022-10-17 DIAGNOSIS — R3912 Poor urinary stream: Secondary | ICD-10-CM | POA: Diagnosis not present

## 2022-10-17 DIAGNOSIS — R972 Elevated prostate specific antigen [PSA]: Secondary | ICD-10-CM | POA: Diagnosis not present

## 2022-10-17 DIAGNOSIS — N401 Enlarged prostate with lower urinary tract symptoms: Secondary | ICD-10-CM | POA: Diagnosis not present

## 2022-11-06 DIAGNOSIS — R3912 Poor urinary stream: Secondary | ICD-10-CM | POA: Diagnosis not present

## 2022-11-06 DIAGNOSIS — R3911 Hesitancy of micturition: Secondary | ICD-10-CM | POA: Diagnosis not present

## 2022-11-06 DIAGNOSIS — R3915 Urgency of urination: Secondary | ICD-10-CM | POA: Diagnosis not present

## 2022-11-06 DIAGNOSIS — N401 Enlarged prostate with lower urinary tract symptoms: Secondary | ICD-10-CM | POA: Diagnosis not present

## 2022-11-25 HISTORY — PX: CYSTOSCOPY: SUR368

## 2022-11-28 DIAGNOSIS — N401 Enlarged prostate with lower urinary tract symptoms: Secondary | ICD-10-CM | POA: Diagnosis not present

## 2022-11-28 DIAGNOSIS — R3912 Poor urinary stream: Secondary | ICD-10-CM | POA: Diagnosis not present

## 2022-12-03 ENCOUNTER — Emergency Department (HOSPITAL_COMMUNITY)
Admission: EM | Admit: 2022-12-03 | Discharge: 2022-12-04 | Disposition: A | Discharge status: Home / Self Care | Payer: Medicare PPO | Attending: Emergency Medicine | Admitting: Emergency Medicine

## 2022-12-03 ENCOUNTER — Other Ambulatory Visit: Payer: Self-pay

## 2022-12-03 ENCOUNTER — Encounter (HOSPITAL_COMMUNITY): Payer: Self-pay | Admitting: Emergency Medicine

## 2022-12-03 ENCOUNTER — Other Ambulatory Visit: Payer: Self-pay | Admitting: Urology

## 2022-12-03 DIAGNOSIS — R339 Retention of urine, unspecified: Secondary | ICD-10-CM | POA: Insufficient documentation

## 2022-12-03 DIAGNOSIS — Z7982 Long term (current) use of aspirin: Secondary | ICD-10-CM | POA: Insufficient documentation

## 2022-12-03 DIAGNOSIS — N189 Chronic kidney disease, unspecified: Secondary | ICD-10-CM | POA: Insufficient documentation

## 2022-12-03 DIAGNOSIS — I129 Hypertensive chronic kidney disease with stage 1 through stage 4 chronic kidney disease, or unspecified chronic kidney disease: Secondary | ICD-10-CM | POA: Diagnosis not present

## 2022-12-03 DIAGNOSIS — R300 Dysuria: Secondary | ICD-10-CM | POA: Diagnosis not present

## 2022-12-03 MED ORDER — SULFAMETHOXAZOLE-TRIMETHOPRIM 800-160 MG PO TABS: 1.0000 | ORAL_TABLET | Freq: Two times a day (BID) | ORAL | 0 refills | Status: AC

## 2022-12-03 NOTE — ED Triage Notes (Signed)
Pt states he was recently diagnosed with a bladder infection and started on an antibiotic today. Presents tonight with c/o urinary retention.

## 2022-12-03 NOTE — ED Provider Notes (Signed)
  [facility] AT [facility] Provider Note   CSN: [id number] Arrival date & time: 12/03/22  2347     Padilla {Add pertinent Padilla, Steven Padilla, Steven Padilla, Steven Padilla to HPI:1} Chief Complaint  Patient presents with   Urinary Retention    Steven Padilla is a 72 y.o. male.  HPI Patient presents for***.  Padilla Padilla Padilla HTN, HLD, BPH, CKD. He is followed by Dr. Ronne Binning with urology.  He was started on Bactrim today for UTI.    Home Medications Prior to Admission medications   Medication Sig Start Date End Date Taking? Authorizing Provider  aspirin EC 81 MG tablet Take 1 tablet by mouth daily.    [provider]  atorvastatin (LIPITOR) 10 MG tablet Take 1 tablet (10 mg total) by mouth daily. 05/08/22   Patwardhan, Anabel Bene, MD  ezetimibe (ZETIA) 10 MG tablet Take 1 tablet (10 mg total) by mouth daily. 05/08/22   Patwardhan, Anabel Bene, MD  fluticasone (FLONASE) 50 MCG/ACT nasal spray Place into both nostrils daily as needed for allergies.    [provider]  irbesartan (AVAPRO) 300 MG tablet Take 300 mg by mouth daily. 12/21/18   [provider]  sulfamethoxazole-trimethoprim (BACTRIM DS) 800-160 MG tablet Take 1 tablet by mouth every 12 (twelve) hours. 12/03/22   McKenzie, Mardene Celeste, MD  tamsulosin (FLOMAX) 0.4 MG CAPS capsule Take 0.4 mg by mouth daily after breakfast.    [provider]  traMADol (ULTRAM) 50 MG tablet Take 1 tablet (50 mg total) by mouth every 6 (six) hours as needed. 08/18/20   Franky Macho, MD      Allergies    Codeine and Flexeril [cyclobenzaprine]    Review of Systems   Review of Systems  Physical Exam Updated Vital Signs Ht 6' 4.5" (1.943 m)   Wt 116 kg   BMI 30.72 kg/m  Physical Exam  ED Results / Procedures / Treatments   Labs (all labs ordered are listed, but only abnormal results are displayed) Labs Reviewed - No data to display  EKG None  Radiology No results  found.  Procedures Procedures  {Document cardiac monitor, telemetry assessment procedure when appropriate:1}  Medications Ordered in ED Medications - No data to display  ED Course/ Padilla Decision Making/ A&P   {   Click here for ABCD2, HEART and other calculatorsREFRESH Note before signing :1}                          Padilla Decision Making  ***  {Document critical care time when appropriate:1} {Document review of labs and clinical decision tools ie heart score, Chads2Vasc2 etc:1}  {Document your independent review of radiology images, and any outside records:1} {Document your discussion with family members, caretakers, and with consultants:1} {Document Steven determinants of health affecting pt's care:1} {Document your decision making why or why not admission, treatments were needed:1} Final Clinical Impression(s) / ED Diagnoses Final diagnoses:  None    Rx / DC Orders ED Discharge Orders     None

## 2022-12-04 LAB — CBC WITH DIFFERENTIAL/PLATELET
Abs Immature Granulocytes: 0.06 10*3/uL (ref 0.00–0.07)
Basophils Absolute: 0 10*3/uL (ref 0.0–0.1)
Basophils Relative: 0 %
Eosinophils Absolute: 0 10*3/uL (ref 0.0–0.5)
Eosinophils Relative: 0 %
HCT: 38.4 % — ABNORMAL LOW (ref 39.0–52.0)
Hemoglobin: 12.7 g/dL — ABNORMAL LOW (ref 13.0–17.0)
Immature Granulocytes: 1 %
Lymphocytes Relative: 7 %
Lymphs Abs: 0.9 10*3/uL (ref 0.7–4.0)
MCH: 30.5 pg (ref 26.0–34.0)
MCHC: 33.1 g/dL (ref 30.0–36.0)
MCV: 92.3 fL (ref 80.0–100.0)
Monocytes Absolute: 1 10*3/uL (ref 0.1–1.0)
Monocytes Relative: 8 %
Neutro Abs: 9.8 10*3/uL — ABNORMAL HIGH (ref 1.7–7.7)
Neutrophils Relative %: 84 %
Platelets: 134 10*3/uL — ABNORMAL LOW (ref 150–400)
RBC: 4.16 MIL/uL — ABNORMAL LOW (ref 4.22–5.81)
RDW: 13 % (ref 11.5–15.5)
WBC: 11.8 10*3/uL — ABNORMAL HIGH (ref 4.0–10.5)
nRBC: 0 % (ref 0.0–0.2)

## 2022-12-04 LAB — BASIC METABOLIC PANEL
Anion gap: 10 (ref 5–15)
BUN: 21 mg/dL (ref 8–23)
CO2: 21 mmol/L — ABNORMAL LOW (ref 22–32)
Calcium: 8.6 mg/dL — ABNORMAL LOW (ref 8.9–10.3)
Chloride: 106 mmol/L (ref 98–111)
Creatinine, Ser: 1.52 mg/dL — ABNORMAL HIGH (ref 0.61–1.24)
GFR, Estimated: 48 mL/min — ABNORMAL LOW (ref >60)
Glucose, Bld: 138 mg/dL — ABNORMAL HIGH (ref 70–99)
Potassium: 3.9 mmol/L (ref 3.5–5.1)
Sodium: 137 mmol/L (ref 135–145)

## 2022-12-04 LAB — URINALYSIS, ROUTINE W REFLEX MICROSCOPIC
Bilirubin Urine: NEGATIVE
Glucose, UA: NEGATIVE mg/dL
Hgb urine dipstick: NEGATIVE
Ketones, ur: NEGATIVE mg/dL
Nitrite: NEGATIVE
Protein, ur: NEGATIVE mg/dL
Specific Gravity, Urine: 1.019 (ref 1.005–1.030)
pH: 5 (ref 5.0–8.0)

## 2022-12-04 LAB — MAGNESIUM: Magnesium: 2 mg/dL (ref 1.7–2.4)

## 2022-12-04 NOTE — Discharge Instructions (Addendum)
Urine culture results will be available in the next 1 to 2 days.  For now, continue antibiotics as prescribed.  Follow-up with urology.  Telephone number is below.  Return to the emergency department for any new or worsening symptoms of concern.

## 2022-12-04 NOTE — ED Notes (Signed)
ED Provider at bedside. 

## 2022-12-05 LAB — URINE CULTURE

## 2022-12-06 ENCOUNTER — Encounter (HOSPITAL_COMMUNITY): Payer: Self-pay

## 2022-12-06 ENCOUNTER — Emergency Department (HOSPITAL_COMMUNITY)
Admission: EM | Admit: 2022-12-06 | Discharge: 2022-12-07 | Disposition: A | Discharge status: Home / Self Care | Payer: Medicare PPO | Attending: Emergency Medicine | Admitting: Emergency Medicine

## 2022-12-06 ENCOUNTER — Other Ambulatory Visit: Payer: Self-pay

## 2022-12-06 DIAGNOSIS — T83098A Other mechanical complication of other indwelling urethral catheter, initial encounter: Secondary | ICD-10-CM | POA: Insufficient documentation

## 2022-12-06 DIAGNOSIS — Z87891 Personal history of nicotine dependence: Secondary | ICD-10-CM | POA: Diagnosis not present

## 2022-12-06 DIAGNOSIS — N183 Chronic kidney disease, stage 3 unspecified: Secondary | ICD-10-CM | POA: Insufficient documentation

## 2022-12-06 DIAGNOSIS — T83091A Other mechanical complication of indwelling urethral catheter, initial encounter: Secondary | ICD-10-CM | POA: Diagnosis not present

## 2022-12-06 DIAGNOSIS — I129 Hypertensive chronic kidney disease with stage 1 through stage 4 chronic kidney disease, or unspecified chronic kidney disease: Secondary | ICD-10-CM | POA: Insufficient documentation

## 2022-12-06 DIAGNOSIS — T839XXA Unspecified complication of genitourinary prosthetic device, implant and graft, initial encounter: Secondary | ICD-10-CM | POA: Diagnosis not present

## 2022-12-06 LAB — URINE CULTURE: Culture: 50000 — AB

## 2022-12-06 NOTE — ED Triage Notes (Signed)
Pt reports we placed a foley for urinary retention and a bladder infection on Sunday.  Pt reports the stat lock is positioned in a place where it tugs on his prostate when he moves his leg and now he has burning pain in his prostate area constantly.

## 2022-12-07 ENCOUNTER — Telehealth: Payer: Self-pay

## 2022-12-07 ENCOUNTER — Telehealth (HOSPITAL_BASED_OUTPATIENT_CLINIC_OR_DEPARTMENT_OTHER): Payer: Self-pay

## 2022-12-07 MED ORDER — CEPHALEXIN 500 MG PO CAPS
500.0000 mg | ORAL_CAPSULE | Freq: Once | ORAL | Status: AC
  Administered 2022-12-07: 500 mg via ORAL
  Filled 2022-12-07: qty 1

## 2022-12-07 MED ORDER — CEPHALEXIN 500 MG PO CAPS: 500.0000 mg | ORAL_CAPSULE | Freq: Three times a day (TID) | ORAL | 0 refills | Status: AC

## 2022-12-07 NOTE — ED Provider Notes (Signed)
[facility]-EMERGENCY DEPT [facility] Emergency Department Provider Note MRN:  [id number]  Arrival date & time: 12/07/22     Chief Complaint   foley problem   History of Present Illness   Steven Padilla is a 72 y.o. year-old male with a history of hypertension presenting to the ED with chief complaint of Foley problem.  Explains that the Foley catheter that was placed few days ago is tugging and causing discomfort.  There is not enough slack between the Foley catheter and his leg sticker.  Review of Systems  A thorough review of systems was obtained and all systems are negative except as noted in the HPI and PMH.   Patient's Health History    Past Medical History:  Diagnosis Date   Bone spur    BPH with elevated PSA    CKD (chronic kidney disease), stage III (HCC)    Detached retina, left    Hyperlipemia    Hypertension    Syncope    Vision disturbance     Past Surgical History:  Procedure Laterality Date   CHOLECYSTECTOMY     INCISIONAL HERNIA REPAIR N/A 08/18/2020   Procedure: HERNIA REPAIR INCISIONAL W/MESH;  Surgeon: Franky Macho, MD;  Location: [facility] ORS;  Service: General;  Laterality: N/A;  pt knows to arrive at 8:30   lt knee     rt shoulder      Family History  Problem Relation Age of Onset   Hypertension Mother    Hypertension Father    Hypertension Brother    Aneurysm Brother     Social History   Socioeconomic History   Marital status: Married    Spouse name: Not on file   Number of children: 2   Years of education: some college   Highest education level: Not on file  Occupational History   Occupation: owns apple orchard   Occupation: retired Engineer, structural  Tobacco Use   Smoking status: Former    Packs/day: 2.00    Years: 20.00    Additional pack years: 0.00    Total pack years: 40.00    Types: Cigarettes, Pipe, Cigars    Quit date: 1983    Years since quitting: 41.4   Smokeless tobacco: Former    Types: Associate Professor Use: Never  used  Substance and Sexual Activity   Alcohol use: Not Currently   Drug use: Not Currently   Sexual activity: Yes  Other Topics Concern   Not on file  Social History Narrative   Lives at home with his wife.   Ambidextrous.   Caffeine use:  One cup per day.   Social Determinants of Health   Financial Resource Strain: Not on file  Food Insecurity: Not on file  Transportation Needs: Not on file  Physical Activity: Not on file  Stress: Not on file  Social Connections: Not on file  Intimate Partner Violence: Not on file     Physical Exam   Vitals:   12/06/22 1932 12/06/22 2300  BP: (!) 159/71 (!) 147/75  Pulse: 72 70  Resp: 16 16  Temp: 99.2 F (37.3 C) 98.3 F (36.8 C)  SpO2: 100% 100%    CONSTITUTIONAL: Well-appearing, NAD NEURO/PSYCH:  Alert and oriented x 3, no focal deficits EYES:  eyes equal and reactive ENT/NECK:  no LAD, no JVD CARDIO: Regular rate, well-perfused, normal S1 and S2 PULM:  CTAB no wheezing or rhonchi GI/GU:  non-distended, non-tender MSK/SPINE:  No gross deformities, no edema  SKIN:  no rash, atraumatic   *Additional and/or pertinent findings included in MDM below  Diagnostic and Interventional Summary    EKG Interpretation  Date/Time:    Ventricular Rate:    PR Interval:    QRS Duration:   QT Interval:    QTC Calculation:   R Axis:     Text Interpretation:         Labs Reviewed - No data to display  No orders to display    Medications  cephALEXin (KEFLEX) capsule 500 mg (has no administration in time range)     Procedures  /  Critical Care Procedures  ED Course and Medical Decision Making  Initial Impression and Ddx ED visit for Foley catheter discomfort.  Patient is correct, there is very little slack between the exiting Foley catheter and the place where it is attached at the leg, nursing to adjust.  Patient having some continued dark/cloudy urine.  Culture data review shows that he has grown Enterococcus bacillus, quite  sensitive to amoxicillin.  He has been on Bactrim and is actually been experiencing some nausea, abdominal bloating, diarrhea.  Will switch him to Keflex.  Past medical/surgical history that increases complexity of ED encounter: Recent urinary retention  Interpretation of Diagnostics Laboratory and/or imaging options to aid in the diagnosis/care of the patient were considered.  After careful history and physical examination, it was determined that there was no indication for diagnostics at this time.  Patient Reassessment and Ultimate Disposition/Management     Discharge.  Adjusted by nursing, he feels a lot better.  Patient management required discussion with the following services or consulting groups:  None  Complexity of Problems Addressed Acute complicated illness or Injury  Additional Data Reviewed and Analyzed Further history obtained from: Further history from spouse/family member  Additional Factors Impacting ED Encounter Risk Prescriptions  Demarquez Ciolek. Pilar Plate, MD [facility] [email or web address]  Final Clinical Impressions(s) / ED Diagnoses     ICD-10-CM   1. Problem with Foley catheter, initial encounter (HCC)  [email or web address]       ED Discharge Orders          Ordered    cephALEXin (KEFLEX) 500 MG capsule  3 times daily        12/07/22 0008             Discharge Instructions Discussed with and Provided to Patient:    Discharge Instructions      You were evaluated in the Emergency Department and after careful evaluation, we did not find any emergent condition requiring admission or further testing in the hospital.  Your exam/testing today is overall reassuring.  We adjusted your Foley catheter here in the emergency department.  Based on your urine culture data, recommend stopping the Bactrim and starting the Keflex antibiotic.  Please return to the Emergency Department if you experience any worsening of your  condition.   Thank you for allowing [place] to be a part of your care.      Sabas Sous, MD 12/07/22 Burna Mortimer

## 2022-12-07 NOTE — Discharge Instructions (Signed)
You were evaluated in the Emergency Department and after careful evaluation, we did not find any emergent condition requiring admission or further testing in the hospital.  Your exam/testing today is overall reassuring.  We adjusted your Foley catheter here in the emergency department.  Based on your urine culture data, recommend stopping the Bactrim and starting the Keflex antibiotic.  Please return to the Emergency Department if you experience any worsening of your condition.   Thank you for allowing [place] to be a part of your care.

## 2022-12-07 NOTE — Progress Notes (Signed)
ED Antimicrobial Stewardship Positive Culture Follow Up   Steven Padilla is an 72 y.o. male who presented to [facility] on 12/06/2022 with a chief complaint of  Chief Complaint  Patient presents with   foley problem    Recent Results (from the past 720 hour(s))  Urine Culture     Status: Abnormal   Collection Time: 12/04/22 12:00 AM   Specimen: Urine, Catheterized  Result Value Ref Range Status   Specimen Description   Final    URINE, CATHETERIZED Performed at [facility], [address], [place], [place] [address]    Special Requests   Final    NONE Performed at [facility], [address] [place], [place] [address]    Culture 50,000 COLONIES/mL ENTEROCOCCUS FAECALIS (A)  Final   Report Status 12/06/2022 FINAL  Final   Organism ID, Bacteria ENTEROCOCCUS FAECALIS (A)  Final      Susceptibility   Enterococcus faecalis - MIC*    AMPICILLIN <=2 SENSITIVE Sensitive     NITROFURANTOIN <=16 SENSITIVE Sensitive     VANCOMYCIN 1 SENSITIVE Sensitive     * 50,000 COLONIES/mL ENTEROCOCCUS FAECALIS    [x]  Treated with Bactrim, then cephalexin, organism resistant to prescribed antimicrobials   New antibiotic prescription: Amoxicillin 1000mg  po q12h  ED Provider: Jacalyn Lefevre, MD   Doristine Counter, PharmD, BCPS 12/07/2022, 11:25 AM Clinical Pharmacist Monday - Friday phone -  [phone] Saturday - Sunday phone - [phone]

## 2022-12-07 NOTE — Telephone Encounter (Signed)
Post ED Visit - Positive Culture Follow-up: Successful Patient Follow-Up  Culture assessed and recommendations reviewed by:  [x]  Andreas Ohm, Pharm.D. []  Celedonio Miyamoto, Pharm.D., BCPS AQ-ID []  Garvin Fila, Pharm.D., BCPS []  Georgina Pillion, Pharm.D., BCPS []  [place], [address].D., BCPS, AAHIVP []  Estella Husk, Pharm.D., BCPS, AAHIVP []  Lysle Pearl, PharmD, BCPS []  Phillips Climes, PharmD, BCPS []  Agapito Games, PharmD, BCPS []  Verlan Friends, PharmD  Positive urine culture  []  Patient discharged without antimicrobial prescription and treatment is now indicated [x]  Organism is resistant to prescribed ED discharge antimicrobial []  Patient with positive blood cultures  Changes discussed with ED provider: Jacalyn Lefevre, MD New antibiotic prescription Amoxicillin 1000 mg po Q 12 hours x 10 days  Called to [facility] @ [phone]  Contacted patient, date 12/07/2022, time 12:38 pm   Sandria Senter 12/07/2022, 12:38 PM

## 2022-12-07 NOTE — Telephone Encounter (Signed)
Return call to patient. Patient states that he had cloudy red color urine.  Patient states that he is a patient of [facility] and prefer to see a urologist here in [place].  Patient states he had to go to the emergency department on yesterday because his catheter and anchor was not in the correct distance which cause tugging and pulling. Patient states that he has an enlarge prostate and had a cysto done which cause infection. Patient states he is currently on an antibiotic for the infection.  Patient voiced that he would like to start care at our practices. Patient was made aware that a message will be sent to our referral coordinate and practice administer on  how to correctly get the patient set up for visit and if he would need a referral having [facility] Advantage insurance. Patient voiced understanding

## 2022-12-08 ENCOUNTER — Telehealth: Payer: Self-pay

## 2022-12-08 NOTE — Telephone Encounter (Signed)
Transition Care Management Follow-up Telephone Call Date of discharge and from where: Steven Padilla 6/10 How have you been since you were released from the hospital? Doing good  Any questions or concerns? No  Items Reviewed: Did the pt receive and understand the discharge instructions provided? Yes  Medications obtained and verified? Yes  Other? No  Any new allergies since your discharge? No  Dietary orders reviewed? No Do you have support at home? Yes    Follow up appointments reviewed:  PCP Hospital f/u appt confirmed? Yes  Scheduled to see PCP on OVER THE PHONE  @ . Specialist Hospital f/u appt confirmed? No  Scheduled to see  on  @ . Are transportation arrangements needed? No  If their condition worsens, is the pt aware to call PCP or go to the Emergency Dept.? Yes Was the patient provided with contact information for the PCP's office or ED? Yes Was to pt encouraged to call back with questions or concerns? Yes

## 2022-12-08 NOTE — Telephone Encounter (Signed)
Patient is made aware and voiced understanding. 

## 2022-12-12 DIAGNOSIS — N401 Enlarged prostate with lower urinary tract symptoms: Secondary | ICD-10-CM | POA: Diagnosis not present

## 2022-12-12 DIAGNOSIS — R338 Other retention of urine: Secondary | ICD-10-CM | POA: Diagnosis not present

## 2022-12-25 DIAGNOSIS — R338 Other retention of urine: Secondary | ICD-10-CM | POA: Diagnosis not present

## 2022-12-25 DIAGNOSIS — N401 Enlarged prostate with lower urinary tract symptoms: Secondary | ICD-10-CM | POA: Diagnosis not present

## 2023-01-09 ENCOUNTER — Ambulatory Visit: Payer: Medicare PPO | Admitting: Family Medicine

## 2023-03-02 DIAGNOSIS — N1832 Chronic kidney disease, stage 3b: Secondary | ICD-10-CM | POA: Diagnosis not present

## 2023-03-02 DIAGNOSIS — E669 Obesity, unspecified: Secondary | ICD-10-CM | POA: Diagnosis not present

## 2023-03-02 DIAGNOSIS — I129 Hypertensive chronic kidney disease with stage 1 through stage 4 chronic kidney disease, or unspecified chronic kidney disease: Secondary | ICD-10-CM | POA: Diagnosis not present

## 2023-03-02 DIAGNOSIS — E785 Hyperlipidemia, unspecified: Secondary | ICD-10-CM | POA: Diagnosis not present

## 2023-03-06 DIAGNOSIS — E669 Obesity, unspecified: Secondary | ICD-10-CM | POA: Diagnosis not present

## 2023-03-06 DIAGNOSIS — N1832 Chronic kidney disease, stage 3b: Secondary | ICD-10-CM | POA: Diagnosis not present

## 2023-03-06 DIAGNOSIS — E785 Hyperlipidemia, unspecified: Secondary | ICD-10-CM | POA: Diagnosis not present

## 2023-03-06 DIAGNOSIS — D696 Thrombocytopenia, unspecified: Secondary | ICD-10-CM | POA: Diagnosis not present

## 2023-03-06 DIAGNOSIS — I129 Hypertensive chronic kidney disease with stage 1 through stage 4 chronic kidney disease, or unspecified chronic kidney disease: Secondary | ICD-10-CM | POA: Diagnosis not present

## 2023-03-06 DIAGNOSIS — R7301 Impaired fasting glucose: Secondary | ICD-10-CM | POA: Diagnosis not present

## 2023-03-06 DIAGNOSIS — Z1211 Encounter for screening for malignant neoplasm of colon: Secondary | ICD-10-CM | POA: Diagnosis not present

## 2023-03-06 DIAGNOSIS — Z0000 Encounter for general adult medical examination without abnormal findings: Secondary | ICD-10-CM | POA: Diagnosis not present

## 2023-05-21 ENCOUNTER — Other Ambulatory Visit: Payer: Self-pay

## 2023-05-21 ENCOUNTER — Other Ambulatory Visit: Payer: Self-pay | Admitting: Cardiology

## 2023-05-21 DIAGNOSIS — E782 Mixed hyperlipidemia: Secondary | ICD-10-CM

## 2023-05-21 MED ORDER — EZETIMIBE 10 MG PO TABS
10.0000 mg | ORAL_TABLET | Freq: Every day | ORAL | 0 refills | Status: DC
Start: 2023-05-21 — End: 2023-06-08

## 2023-06-08 ENCOUNTER — Telehealth: Payer: Self-pay | Admitting: Cardiology

## 2023-06-08 ENCOUNTER — Other Ambulatory Visit: Payer: Self-pay | Admitting: Cardiology

## 2023-06-08 DIAGNOSIS — E782 Mixed hyperlipidemia: Secondary | ICD-10-CM

## 2023-06-08 MED ORDER — ATORVASTATIN CALCIUM 10 MG PO TABS
10.0000 mg | ORAL_TABLET | Freq: Every day | ORAL | 0 refills | Status: DC
Start: 2023-06-08 — End: 2023-07-09

## 2023-06-08 MED ORDER — EZETIMIBE 10 MG PO TABS
10.0000 mg | ORAL_TABLET | Freq: Every day | ORAL | 0 refills | Status: DC
Start: 2023-06-08 — End: 2023-08-14

## 2023-06-08 NOTE — Telephone Encounter (Signed)
*  STAT* If patient is at the pharmacy, call can be transferred to refill team.   1. Which medications need to be refilled? (please list name of each medication and dose if known) atorvastatin (LIPITOR) 10 MG tablet   ezetimibe (ZETIA) 10 MG tablet   2. Which pharmacy/location (including street and city if local pharmacy) is medication to be sent to?  [facility] [place], [place] - [address]    3. Do they need a 30 day or 90 day supply? 90

## 2023-06-27 DIAGNOSIS — Z1211 Encounter for screening for malignant neoplasm of colon: Secondary | ICD-10-CM | POA: Diagnosis not present

## 2023-07-03 DIAGNOSIS — N401 Enlarged prostate with lower urinary tract symptoms: Secondary | ICD-10-CM | POA: Diagnosis not present

## 2023-07-03 DIAGNOSIS — R3912 Poor urinary stream: Secondary | ICD-10-CM | POA: Diagnosis not present

## 2023-07-05 LAB — COLOGUARD: COLOGUARD: NEGATIVE

## 2023-07-09 ENCOUNTER — Telehealth: Payer: Self-pay | Admitting: *Deleted

## 2023-07-09 ENCOUNTER — Ambulatory Visit: Payer: Medicare PPO | Attending: Cardiology | Admitting: Cardiology

## 2023-07-09 ENCOUNTER — Encounter: Payer: Self-pay | Admitting: Cardiology

## 2023-07-09 VITALS — BP 120/62 | HR 76 | Ht 76.5 in | Wt 263.4 lb

## 2023-07-09 DIAGNOSIS — I6523 Occlusion and stenosis of bilateral carotid arteries: Secondary | ICD-10-CM | POA: Diagnosis not present

## 2023-07-09 DIAGNOSIS — E782 Mixed hyperlipidemia: Secondary | ICD-10-CM

## 2023-07-09 DIAGNOSIS — I6501 Occlusion and stenosis of right vertebral artery: Secondary | ICD-10-CM

## 2023-07-09 DIAGNOSIS — Z01810 Encounter for preprocedural cardiovascular examination: Secondary | ICD-10-CM

## 2023-07-09 MED ORDER — ATORVASTATIN CALCIUM 20 MG PO TABS
20.0000 mg | ORAL_TABLET | Freq: Every day | ORAL | 2 refills | Status: AC
Start: 2023-07-09 — End: ?

## 2023-07-09 NOTE — Telephone Encounter (Signed)
 Per preop APP Orren Fabry, [facility] states: Just got a message from Dr. Elmira about this patient being cleared for prostate surgery. I do not see a surgical request sent to us . Happy to forward on to have a fax number.   I called [facility] and asked if their office will please fax over the clearance request today as the pt has been cleared by Dr. Elmira today. Per Orren Fabry, PAC once we have the request from Dr. Selma we will fax clearance notes to their office.   I gave fax# [phone] attn preop team

## 2023-07-09 NOTE — Progress Notes (Signed)
 Cardiology Office Note:  .   Date:  07/09/2023  ID:  Steven Padilla, DOB [birth year], MRN [id number] PCP: Avva, Ravisankar, MD  [facility] Providers Cardiologist:  Newman Lawrence, MD PCP: Avva, Ravisankar, MD  Chief Complaint  Patient presents with   Hypertension      History of Present Illness: Steven    Steven Padilla is a 73 y.o. male with hypertension, hyperlipidemia, Rt vertebral artery stenosis, family h/o brain aneurysm, h/o presyncope.  I last saw the patient in 03/2021.  He recently had urinary retention requiring Foley catheter.  He is going to undergo prostate surgery by Dr. Selma in 07/2023.   He stays active.  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  He works on the orchard regularly, will start planting them next month.  He denies any complains of chest pain, shortness of breath.  He has only occasional dizziness symptoms if he stands up too quickly.  Reviewed results from 02/2023, details below.    Vitals:   07/09/23 1041  BP: 120/62  Pulse: 76  SpO2: 97%     ROS:  Review of Systems  Cardiovascular:  Negative for chest pain, dyspnea on exertion, leg swelling, palpitations and syncope.     Studies Reviewed: Steven        EKG 07/09/2023: Normal sinus rhythm Low voltage (<0.5 millivolts total QRS voltage in each extremity lead, and <1.0 millivolts in each precordial lead) No significant change since last tracing     Independently interpreted 02/2023: Chol 135, TG 96, HDL 42, LDL 80 HbA1C 5.4% Hb 13.9 Cr 1.5 TSH 1.8  Carotid artery duplex 07/13/2020:  Duplex suggests stenosis in the right internal carotid artery (16-49%).  Duplex suggests stenosis in the right common carotid artery (>50%).  Duplex suggests stenosis in the left internal carotid artery (16-49%).  Duplex suggests stenosis in the left common carotid artery (<50%).  There is diffuse homogeneous plaque in bilateral carotid arteries.  Antegrade right vertebral artery flow. Antegrade left  vertebral artery  flow.  Aggressive risk modification indicated. (see images). Follow up in one  year is appropriate if clinically indicated.      Physical Exam:   Physical Exam Vitals and nursing note reviewed.  Constitutional:      General: He is not in acute distress. Neck:     Vascular: No JVD.  Cardiovascular:     Rate and Rhythm: Normal rate and regular rhythm.     Heart sounds: Normal heart sounds. No murmur heard. Pulmonary:     Effort: Pulmonary effort is normal.     Breath sounds: Normal breath sounds. No wheezing or rales.  Musculoskeletal:     Right lower leg: No edema.     Left lower leg: No edema.      VISIT DIAGNOSES:   ICD-10-CM   1. Mixed hyperlipidemia  E78.2 EKG 12-Lead    atorvastatin  (LIPITOR) 20 MG tablet    Lipid Profile    Lipid Profile    2. Stenosis of right vertebral artery  I65.01 VAS US  CAROTID    3. Bilateral carotid artery stenosis  I65.23 VAS US  CAROTID       ASSESSMENT AND PLAN: .    Steven Padilla is a 73 y.o. male with hypertension, hyperlipidemia, mild carotid artery disease, right vertebral artery stenosis  Hypertension: Controlled  Hyperlipidemia: LDL 80 in 02/2023. In light of his mild carotid disease and right vertebral artery stenosis, recommend increasing Lipitor to 20 mg daily.  Continue  Zetia  10 mg daily.  Check lipid panel in 3 months.  Carotid artery disease: Mild to moderate in 2022, normal right vertebral artery stenosis. Check carotid artery duplex.  Preop evaluation: Low cardiac risk for upcoming prostate surgery.   F/u in 1 year  Signed, Newman JINNY Lawrence, MD

## 2023-07-09 NOTE — Patient Instructions (Signed)
 Medication Instructions:   INCREASE YOUR ATORVASTATIN  (LIPITOR) TO 20 MG BY MOUTH DAILY  *If you need a refill on your cardiac medications before your next appointment, please call your pharmacy*   Lab Work:  IN 3 MONTHS AT [facility] IN [place] (THIS WOULD BE AROUND MID-APRIL)--LIPIDS--PLEASE GO FASTING TO THIS LAB APPOINTMENT  If you have labs (blood work) drawn today and your tests are completely normal, you will receive your results only by: MyChart Message (if you have MyChart) OR A paper copy in the mail If you have any lab test that is abnormal or we need to change your treatment, we will call you to review the results.    Testing/Procedures:  Your physician has requested that you have a carotid duplex. This test is an ultrasound of the carotid arteries in your neck. It looks at blood flow through these arteries that supply the brain with blood. Allow one hour for this exam. There are no restrictions or special instructions.    Follow-Up: At [facility], you and your health needs are our priority.  As part of our continuing mission to provide you with exceptional heart care, we have created designated Provider Care Teams.  These Care Teams include your primary Cardiologist (physician) and Advanced Practice Providers (APPs -  Physician Assistants and Nurse Practitioners) who all work together to provide you with the care you need, when you need it.  We recommend signing up for the patient portal called MyChart.  Sign up information is provided on this After Visit Summary.  MyChart is used to connect with patients for Virtual Visits (Telemedicine).  Patients are able to view lab/test results, encounter notes, upcoming appointments, etc.  Non-urgent messages can be sent to your provider as well.   To learn more about what you can do with MyChart, go to [email or web address].    Your next appointment:   1 year(s)  Provider:   DR. PATWARDHAN

## 2023-07-16 ENCOUNTER — Other Ambulatory Visit: Payer: Self-pay | Admitting: Urology

## 2023-07-19 ENCOUNTER — Encounter (HOSPITAL_BASED_OUTPATIENT_CLINIC_OR_DEPARTMENT_OTHER): Payer: Self-pay | Admitting: Urology

## 2023-07-19 ENCOUNTER — Other Ambulatory Visit: Payer: Self-pay

## 2023-07-19 NOTE — Progress Notes (Signed)
Spoke w/ via phone for pre-op interview---Steven Padilla needs dos----  ISTAT       Padilla results------07/09/23 EKG in chart & Epic COVID test -----patient states asymptomatic no test needed Arrive at -------1000 on Friday, 07/20/23 NPO after MN NO Solid Food.  Clear liquids from MN until---0900 Med rec completed Medications to take morning of surgery -----Lipitor, Zetia, Tramadol prn Diabetic medication ----- Patient instructed no nail polish to be worn day of surgery Patient instructed to bring photo id and insurance card day of surgery Patient aware to have Driver (ride ) / caregiver    for 24 hours after surgery - wife, [name] Patient Special Instructions -----Extended / overnight stay instructions given. Pre-Op special Instructions -----none Patient verbalized understanding of instructions that were given at this phone interview. Patient denies chest pain, sob, fever, cough at the interview.   Patient has a hx of HTN, HLD, right verterbral artery stenosis, mild carotid disease, pre-syncope. He follows w/ cardiologist, Dr. Rosemary Holms. Patient has cardiac clearance dated 07/09/2023 from Dr. Arnell Sieving in Epic & chart.

## 2023-07-20 ENCOUNTER — Encounter (HOSPITAL_BASED_OUTPATIENT_CLINIC_OR_DEPARTMENT_OTHER): Payer: Self-pay | Admitting: Urology

## 2023-07-20 ENCOUNTER — Encounter (HOSPITAL_BASED_OUTPATIENT_CLINIC_OR_DEPARTMENT_OTHER)
Admission: RE | Disposition: A | Discharge status: Home / Self Care | Payer: Self-pay | Source: Home / Self Care | Attending: Urology

## 2023-07-20 ENCOUNTER — Ambulatory Visit (HOSPITAL_BASED_OUTPATIENT_CLINIC_OR_DEPARTMENT_OTHER): Payer: Medicare PPO | Admitting: Anesthesiology

## 2023-07-20 ENCOUNTER — Ambulatory Visit (HOSPITAL_BASED_OUTPATIENT_CLINIC_OR_DEPARTMENT_OTHER)
Admission: RE | Admit: 2023-07-20 | Discharge: 2023-07-21 | Disposition: A | Discharge status: Home / Self Care | Payer: Medicare PPO | Attending: Urology | Admitting: Urology

## 2023-07-20 ENCOUNTER — Other Ambulatory Visit: Payer: Self-pay

## 2023-07-20 DIAGNOSIS — N401 Enlarged prostate with lower urinary tract symptoms: Secondary | ICD-10-CM | POA: Diagnosis not present

## 2023-07-20 DIAGNOSIS — N138 Other obstructive and reflux uropathy: Secondary | ICD-10-CM

## 2023-07-20 DIAGNOSIS — Z79899 Other long term (current) drug therapy: Secondary | ICD-10-CM | POA: Diagnosis not present

## 2023-07-20 DIAGNOSIS — N400 Enlarged prostate without lower urinary tract symptoms: Secondary | ICD-10-CM | POA: Diagnosis present

## 2023-07-20 DIAGNOSIS — I10 Essential (primary) hypertension: Secondary | ICD-10-CM

## 2023-07-20 DIAGNOSIS — R3912 Poor urinary stream: Secondary | ICD-10-CM | POA: Insufficient documentation

## 2023-07-20 DIAGNOSIS — N411 Chronic prostatitis: Secondary | ICD-10-CM | POA: Diagnosis not present

## 2023-07-20 DIAGNOSIS — N183 Chronic kidney disease, stage 3 unspecified: Secondary | ICD-10-CM | POA: Diagnosis not present

## 2023-07-20 DIAGNOSIS — R3915 Urgency of urination: Secondary | ICD-10-CM | POA: Diagnosis not present

## 2023-07-20 DIAGNOSIS — Z87891 Personal history of nicotine dependence: Secondary | ICD-10-CM | POA: Diagnosis not present

## 2023-07-20 DIAGNOSIS — I129 Hypertensive chronic kidney disease with stage 1 through stage 4 chronic kidney disease, or unspecified chronic kidney disease: Secondary | ICD-10-CM | POA: Insufficient documentation

## 2023-07-20 DIAGNOSIS — N182 Chronic kidney disease, stage 2 (mild): Secondary | ICD-10-CM | POA: Insufficient documentation

## 2023-07-20 DIAGNOSIS — E782 Mixed hyperlipidemia: Secondary | ICD-10-CM | POA: Diagnosis not present

## 2023-07-20 DIAGNOSIS — N320 Bladder-neck obstruction: Secondary | ICD-10-CM | POA: Diagnosis not present

## 2023-07-20 DIAGNOSIS — Z01818 Encounter for other preprocedural examination: Secondary | ICD-10-CM

## 2023-07-20 DIAGNOSIS — N402 Nodular prostate without lower urinary tract symptoms: Secondary | ICD-10-CM | POA: Diagnosis not present

## 2023-07-20 HISTORY — DX: Occlusion and stenosis of right vertebral artery: I65.01

## 2023-07-20 HISTORY — DX: Unspecified osteoarthritis, unspecified site: M19.90

## 2023-07-20 HISTORY — PX: TRANSURETHRAL RESECTION OF PROSTATE: SHX73

## 2023-07-20 HISTORY — DX: Presence of spectacles and contact lenses: Z97.3

## 2023-07-20 HISTORY — DX: Occlusion and stenosis of bilateral carotid arteries: I65.23

## 2023-07-20 LAB — CBC
HCT: 40.9 % (ref 39.0–52.0)
Hemoglobin: 13.4 g/dL (ref 13.0–17.0)
MCH: 31.3 pg (ref 26.0–34.0)
MCHC: 32.8 g/dL (ref 30.0–36.0)
MCV: 95.6 fL (ref 80.0–100.0)
Platelets: 124 10*3/uL — ABNORMAL LOW (ref 150–400)
RBC: 4.28 MIL/uL (ref 4.22–5.81)
RDW: 13.2 % (ref 11.5–15.5)
WBC: 5.5 10*3/uL (ref 4.0–10.5)
nRBC: 0 % (ref 0.0–0.2)

## 2023-07-20 LAB — BASIC METABOLIC PANEL
Anion gap: 9 (ref 5–15)
BUN: 30 mg/dL — ABNORMAL HIGH (ref 8–23)
CO2: 21 mmol/L — ABNORMAL LOW (ref 22–32)
Calcium: 9.2 mg/dL (ref 8.9–10.3)
Chloride: 111 mmol/L (ref 98–111)
Creatinine, Ser: 1.55 mg/dL — ABNORMAL HIGH (ref 0.61–1.24)
GFR, Estimated: 47 mL/min — ABNORMAL LOW (ref >60)
Glucose, Bld: 115 mg/dL — ABNORMAL HIGH (ref 70–99)
Potassium: 4.6 mmol/L (ref 3.5–5.1)
Sodium: 141 mmol/L (ref 135–145)

## 2023-07-20 LAB — POCT I-STAT, CHEM 8
BUN: 29 mg/dL — ABNORMAL HIGH (ref 8–23)
Calcium, Ion: 1.35 mmol/L (ref 1.15–1.40)
Chloride: 110 mmol/L (ref 98–111)
Creatinine, Ser: 1.6 mg/dL — ABNORMAL HIGH (ref 0.61–1.24)
Glucose, Bld: 110 mg/dL — ABNORMAL HIGH (ref 70–99)
HCT: 42 % (ref 39.0–52.0)
Hemoglobin: 14.3 g/dL (ref 13.0–17.0)
Potassium: 4.4 mmol/L (ref 3.5–5.1)
Sodium: 143 mmol/L (ref 135–145)
TCO2: 22 mmol/L (ref 22–32)

## 2023-07-20 SURGERY — TURP (TRANSURETHRAL RESECTION OF PROSTATE)
Anesthesia: General | Site: Prostate

## 2023-07-20 MED ORDER — TAMSULOSIN HCL 0.4 MG PO CAPS
0.4000 mg | ORAL_CAPSULE | Freq: Every evening | ORAL | Status: DC
  Administered 2023-07-20: 0.4 mg via ORAL

## 2023-07-20 MED ORDER — MIDAZOLAM HCL 2 MG/2ML IJ SOLN
INTRAMUSCULAR | Status: AC
Start: 2023-07-20 — End: ?
  Filled 2023-07-20: qty 2

## 2023-07-20 MED ORDER — SODIUM CHLORIDE 0.9 % IR SOLN
3000.0000 mL | Status: DC
  Administered 2023-07-20 – 2023-07-21 (×3): 3000 mL

## 2023-07-20 MED ORDER — CEFAZOLIN SODIUM-DEXTROSE 2-4 GM/100ML-% IV SOLN
2.0000 g | INTRAVENOUS | Status: AC
  Administered 2023-07-20: 2 g via INTRAVENOUS

## 2023-07-20 MED ORDER — PHENYLEPHRINE 80 MCG/ML (10ML) SYRINGE FOR IV PUSH (FOR BLOOD PRESSURE SUPPORT)
PREFILLED_SYRINGE | INTRAVENOUS | Status: AC
  Filled 2023-07-20: qty 10

## 2023-07-20 MED ORDER — DIPHENHYDRAMINE HCL 12.5 MG/5ML PO ELIX
12.5000 mg | ORAL_SOLUTION | Freq: Four times a day (QID) | ORAL | Status: DC | PRN
Start: 2023-07-20 — End: 2023-07-21

## 2023-07-20 MED ORDER — MIDAZOLAM HCL 2 MG/2ML IJ SOLN
INTRAMUSCULAR | Status: DC | PRN
  Administered 2023-07-20: 1 mg via INTRAVENOUS

## 2023-07-20 MED ORDER — FENTANYL CITRATE (PF) 250 MCG/5ML IJ SOLN
INTRAMUSCULAR | Status: DC | PRN
  Administered 2023-07-20: 50 ug via INTRAVENOUS
  Administered 2023-07-20 (×2): 25 ug via INTRAVENOUS

## 2023-07-20 MED ORDER — SODIUM CHLORIDE 0.9 % IR SOLN
Status: DC | PRN
  Administered 2023-07-20: 6000 mL
  Administered 2023-07-20: 3000 mL
  Administered 2023-07-20 (×2): 6000 mL

## 2023-07-20 MED ORDER — FINASTERIDE 5 MG PO TABS
5.0000 mg | ORAL_TABLET | Freq: Every day | ORAL | Status: DC
Start: 2023-07-20 — End: 2023-07-21
  Administered 2023-07-20: 5 mg via ORAL
  Filled 2023-07-20: qty 1

## 2023-07-20 MED ORDER — LIDOCAINE 2% (20 MG/ML) 5 ML SYRINGE
INTRAMUSCULAR | Status: DC | PRN
  Administered 2023-07-20: 100 mg via INTRAVENOUS

## 2023-07-20 MED ORDER — EPHEDRINE SULFATE-NACL 50-0.9 MG/10ML-% IV SOSY
PREFILLED_SYRINGE | INTRAVENOUS | Status: DC | PRN
  Administered 2023-07-20 (×2): 10 mg via INTRAVENOUS

## 2023-07-20 MED ORDER — TAMSULOSIN HCL 0.4 MG PO CAPS
ORAL_CAPSULE | ORAL | Status: AC
  Filled 2023-07-20: qty 1

## 2023-07-20 MED ORDER — PHENYLEPHRINE 80 MCG/ML (10ML) SYRINGE FOR IV PUSH (FOR BLOOD PRESSURE SUPPORT)
PREFILLED_SYRINGE | INTRAVENOUS | Status: DC | PRN
  Administered 2023-07-20 (×6): 80 ug via INTRAVENOUS

## 2023-07-20 MED ORDER — ATORVASTATIN CALCIUM 20 MG PO TABS: 20.0000 mg | ORAL_TABLET | Freq: Every day | ORAL | Status: DC

## 2023-07-20 MED ORDER — EPHEDRINE 5 MG/ML INJ
INTRAVENOUS | Status: AC
Start: 2023-07-20 — End: ?
  Filled 2023-07-20: qty 10

## 2023-07-20 MED ORDER — IRBESARTAN 300 MG PO TABS
300.0000 mg | ORAL_TABLET | Freq: Every day | ORAL | Status: DC
  Administered 2023-07-21: 300 mg via ORAL
  Filled 2023-07-20: qty 1

## 2023-07-20 MED ORDER — FLUTICASONE PROPIONATE 50 MCG/ACT NA SUSP: 1.0000 | Freq: Every day | NASAL | Status: DC | PRN

## 2023-07-20 MED ORDER — ONDANSETRON HCL 4 MG/2ML IJ SOLN: 4.0000 mg | INTRAMUSCULAR | Status: DC | PRN

## 2023-07-20 MED ORDER — FENTANYL CITRATE (PF) 100 MCG/2ML IJ SOLN: 25.0000 ug | INTRAMUSCULAR | Status: DC | PRN

## 2023-07-20 MED ORDER — AMISULPRIDE (ANTIEMETIC) 5 MG/2ML IV SOLN: 10.0000 mg | Freq: Once | INTRAVENOUS | Status: DC | PRN

## 2023-07-20 MED ORDER — STERILE WATER FOR IRRIGATION IR SOLN
Status: DC | PRN
  Administered 2023-07-20: 500 mL

## 2023-07-20 MED ORDER — CEFAZOLIN SODIUM-DEXTROSE 2-4 GM/100ML-% IV SOLN
INTRAVENOUS | Status: AC
  Filled 2023-07-20: qty 100

## 2023-07-20 MED ORDER — DIPHENHYDRAMINE HCL 50 MG/ML IJ SOLN
12.5000 mg | Freq: Four times a day (QID) | INTRAMUSCULAR | Status: DC | PRN
Start: 2023-07-20 — End: 2023-07-21

## 2023-07-20 MED ORDER — CHLORHEXIDINE GLUCONATE CLOTH 2 % EX PADS: 6.0000 | MEDICATED_PAD | Freq: Every day | CUTANEOUS | Status: DC

## 2023-07-20 MED ORDER — DEXAMETHASONE SODIUM PHOSPHATE 10 MG/ML IJ SOLN
INTRAMUSCULAR | Status: DC | PRN
  Administered 2023-07-20: 5 mg via INTRAVENOUS

## 2023-07-20 MED ORDER — PROPOFOL 10 MG/ML IV BOLUS
INTRAVENOUS | Status: AC
  Filled 2023-07-20: qty 20

## 2023-07-20 MED ORDER — EPHEDRINE 5 MG/ML INJ
INTRAVENOUS | Status: AC
  Filled 2023-07-20: qty 5

## 2023-07-20 MED ORDER — 0.9 % SODIUM CHLORIDE (POUR BTL) OPTIME
TOPICAL | Status: DC | PRN
  Administered 2023-07-20: 500 mL

## 2023-07-20 MED ORDER — ACETAMINOPHEN 500 MG PO TABS: 1000.0000 mg | ORAL_TABLET | Freq: Once | ORAL | Status: DC

## 2023-07-20 MED ORDER — OXYCODONE-ACETAMINOPHEN 5-325 MG PO TABS: 1.0000 | ORAL_TABLET | ORAL | Status: DC | PRN

## 2023-07-20 MED ORDER — ZOLPIDEM TARTRATE 5 MG PO TABS: 5.0000 mg | ORAL_TABLET | Freq: Every evening | ORAL | Status: DC | PRN

## 2023-07-20 MED ORDER — OXYCODONE HCL 5 MG/5ML PO SOLN: 5.0000 mg | Freq: Once | ORAL | Status: DC | PRN

## 2023-07-20 MED ORDER — ACETAMINOPHEN 500 MG PO TABS
ORAL_TABLET | ORAL | Status: AC
  Filled 2023-07-20: qty 2

## 2023-07-20 MED ORDER — HYDROMORPHONE HCL 1 MG/ML IJ SOLN: 0.5000 mg | INTRAMUSCULAR | Status: DC | PRN

## 2023-07-20 MED ORDER — FENTANYL CITRATE (PF) 100 MCG/2ML IJ SOLN
INTRAMUSCULAR | Status: AC
  Filled 2023-07-20: qty 2

## 2023-07-20 MED ORDER — EZETIMIBE 10 MG PO TABS: 10.0000 mg | ORAL_TABLET | Freq: Every day | ORAL | Status: DC

## 2023-07-20 MED ORDER — ONDANSETRON HCL 4 MG/2ML IJ SOLN
INTRAMUSCULAR | Status: DC | PRN
  Administered 2023-07-20: 4 mg via INTRAVENOUS

## 2023-07-20 MED ORDER — SODIUM CHLORIDE 0.9 % IV SOLN: INTRAVENOUS | Status: DC

## 2023-07-20 MED ORDER — PROPOFOL 10 MG/ML IV BOLUS
INTRAVENOUS | Status: DC | PRN
  Administered 2023-07-20: 200 mg via INTRAVENOUS

## 2023-07-20 MED ORDER — ACETAMINOPHEN 325 MG PO TABS: 650.0000 mg | ORAL_TABLET | ORAL | Status: DC | PRN

## 2023-07-20 MED ORDER — OXYCODONE HCL 5 MG PO TABS: 5.0000 mg | ORAL_TABLET | Freq: Once | ORAL | Status: DC | PRN

## 2023-07-20 SURGICAL SUPPLY — 26 items
BAG DRAIN URO-CYSTO SKYTR STRL (DRAIN) ×1 IMPLANT
BAG URINE DRAIN 2000ML AR STRL (UROLOGICAL SUPPLIES) ×1 IMPLANT
BAG URINE LEG 500ML (DRAIN) IMPLANT
CATH FOLEY 2WAY SLVR 30CC 20FR (CATHETERS) IMPLANT
CATH FOLEY 3WAY 30CC 22FR (CATHETERS) ×1 IMPLANT
CLOTH BEACON ORANGE TIMEOUT ST (SAFETY) ×1 IMPLANT
ELECT BIVAP BIPO 22/24 DONUT (ELECTROSURGICAL)
ELECT REM PT RETURN 9FT ADLT (ELECTROSURGICAL)
ELECTRD BIVAP BIPO 22/24 DONUT (ELECTROSURGICAL) IMPLANT
ELECTRODE REM PT RTRN 9FT ADLT (ELECTROSURGICAL) IMPLANT
GLOVE BIO SURGEON STRL SZ7 (GLOVE) ×1 IMPLANT
GOWN STRL REUS W/TWL LRG LVL3 (GOWN DISPOSABLE) ×1 IMPLANT
HOLDER FOLEY CATH W/STRAP (MISCELLANEOUS) ×1 IMPLANT
IV NS IRRIG 3000ML ARTHROMATIC (IV SOLUTION) ×2 IMPLANT
KIT TURNOVER CYSTO (KITS) ×1 IMPLANT
LOOP CUT BIPOLAR 24F LRG (ELECTROSURGICAL) ×1 IMPLANT
MANIFOLD NEPTUNE II (INSTRUMENTS) ×1 IMPLANT
NS IRRIG 500ML POUR BTL (IV SOLUTION) IMPLANT
PACK CYSTO (CUSTOM PROCEDURE TRAY) ×1 IMPLANT
SLEEVE SCD COMPRESS KNEE MED (STOCKING) ×1 IMPLANT
SYR 30ML LL (SYRINGE) ×1 IMPLANT
SYR TOOMEY IRRIG 70ML (MISCELLANEOUS) ×1
SYRINGE TOOMEY IRRIG 70ML (MISCELLANEOUS) ×1 IMPLANT
TUBE CONNECTING 12X1/4 (SUCTIONS) ×1 IMPLANT
TUBING UROLOGY SET (TUBING) ×1 IMPLANT
WATER STERILE IRR 500ML POUR (IV SOLUTION) IMPLANT

## 2023-07-20 NOTE — Transfer of Care (Signed)
Immediate Anesthesia Transfer of Care Note  Patient: Steven Padilla  Procedure(s) Performed: BIPOLAR TRANSURETHRAL RESECTION OF THE PROSTATE (TURP) (Prostate)  Patient Location: PACU  Anesthesia Type:General  Level of Consciousness: awake, alert , and oriented  Airway & Oxygen Therapy: Patient Spontanous Breathing  Post-op Assessment: Report given to RN and Post -op Vital signs reviewed and stable  Post vital signs: Reviewed and stable  Last Vitals:  Vitals Value Taken Time  BP 132/57 07/20/23 1233  Temp    Pulse 72 07/20/23 1242  Resp 17 07/20/23 1242  SpO2 95 % 07/20/23 1242  Vitals shown include unfiled device data.  Last Pain:  Vitals:   07/20/23 1035  TempSrc: Oral  PainSc: 1       Patients Stated Pain Goal: 4 (07/20/23 1035)  Complications: No notable events documented.

## 2023-07-20 NOTE — Discharge Instructions (Signed)
Activity:  You are encouraged to ambulate frequently (about every hour during waking hours) to help prevent blood clots from forming in your legs or lungs.   ? ?Diet: You should advance your diet as instructed by your physician.  It will be normal to have some bloating, nausea, and abdominal discomfort intermittently. ? ?Prescriptions:  You will be provided a prescription for pain medication to take as needed.  If your pain is not severe enough to require the prescription pain medication, you may take extra strength Tylenol instead which will have less side effects.  You should also take a prescribed stool softener to avoid straining with bowel movements as the prescription pain medication may constipate you. ? ?What to call us about: You should call the office [phone]) if you develop fever > 101 or develop persistent vomiting. Activity:  You are encouraged to ambulate frequently (about every hour during waking hours) to help prevent blood clots from forming in your legs or lungs.   ? ?You have a foley catheter in place. Return to clinic in 3 days for foley removal. ? ?

## 2023-07-20 NOTE — Anesthesia Postprocedure Evaluation (Signed)
Anesthesia Post Note  Patient: Steven Padilla  Procedure(s) Performed: BIPOLAR TRANSURETHRAL RESECTION OF THE PROSTATE (TURP) (Prostate)     Patient location during evaluation: PACU Anesthesia Type: General Level of consciousness: awake Pain management: pain level controlled Vital Signs Assessment: post-procedure vital signs reviewed and stable Respiratory status: spontaneous breathing, nonlabored ventilation and respiratory function stable Cardiovascular status: blood pressure returned to baseline and stable Postop Assessment: no apparent nausea or vomiting Anesthetic complications: no   No notable events documented.  Last Vitals:  Vitals:   07/20/23 1300 07/20/23 1315  BP: (!) 121/58   Pulse: 65 64  Resp: 13 12  Temp:    SpO2: 93% 94%    Last Pain:  Vitals:   07/20/23 1300  TempSrc:   PainSc: 0-No pain                 Linton Rump

## 2023-07-20 NOTE — Anesthesia Procedure Notes (Signed)
Procedure Name: LMA Insertion Date/Time: 07/20/2023 11:27 AM  Performed by: Dairl Ponder, CRNAPre-anesthesia Checklist: Patient identified, Emergency Drugs available, Suction available and Patient being monitored Patient Re-evaluated:Patient Re-evaluated prior to induction Oxygen Delivery Method: Circle System Utilized Preoxygenation: Pre-oxygenation with 100% oxygen Induction Type: IV induction Ventilation: Mask ventilation without difficulty LMA: LMA inserted LMA Size: 5.0 Number of attempts: 1 Airway Equipment and Method: Bite block Placement Confirmation: positive ETCO2 Tube secured with: Tape Dental Injury: Teeth and Oropharynx as per pre-operative assessment

## 2023-07-20 NOTE — Op Note (Signed)
Operative Note  Preoperative diagnosis:  1.  BPH with bladder outlet obstruction  Postoperative diagnosis: 1.  BPH with bladder outlet obstruction  Procedure(s): 1.  Bipolar transurethral resection of prostate  Surgeon: Jettie Pagan, MD  Assistants:  None  Anesthesia:  General  Complications:  None  EBL:  Minimal  Specimens: 1. Prostate chips ID Type Source Tests Collected by Time Destination  1 : Prostate chips Tissue PATH GU resection / TURBT / partial nephrectomy SURGICAL PATHOLOGY Jannifer Hick, MD 07/20/2023 1222    Drains/Catheters: 1.  22Fr 3 way catheter with 30ml water into balloon  Intraoperative findings:   Bilobar obstructing prostate   Indication:  Steven Padilla is a 73 y.o. male with BPH with bladder outlet obstruction presenting for transurethral resection of the prostate. He had a TRUS volume of 73cc. Uroflow demonstrated 54ml/sec. Preop cystoscopy demonstrated bilobar obstructing prostate. He has failed alpha blockers.  After thorough discussion including all relevant risk benefits and alternatives, he presents today for a bipolar TURP.  Description of procedure: The indication, alternatives, benefits and risks were discussed with the patient and informed consent was obtained.  Patient was brought to the operating room table, positioned supine, secured with a safety strap.  Pneumatic compression devices were placed on the lower extremities.  After the administration of intravenous antibiotics and general anesthesia, the patient was repositioned into the dorsal lithotomy position.  All pressure points were carefully padded.  A rectal examination was performed confirming a smooth symmetric enlarged gland.  The genitalia were prepped and draped in standard sterile manner.  A timeout was completed, verifying the correct patient, surgical procedure and positioning prior to beginning the procedure.  Isotonic sodium chloride was used for irrigation.  A 26 French  continuous-flow resectoscope sheath with the visual obturator and a 30 degree lens was advanced under direct vision into the bladder.  The anterior urethra appeared normal in its entirety.The prostatic urethra was elongated with bilobar hyperplasia.  On cystoscopic evaluation, his bladder capacity appeared normal, the bladder wall was noted to expand symmetrically in all dimensions.  There were no tumors, stones or foreign bodies present. The bladder was trabeculated with normal-appearing mucosa.  Both ureteral orifices were in their normal anatomic positions with clear urinary reflux noted bilaterally.  The obturator was removed and replaced by the working element with a resection loop.  The location of the ureteral orifices and the prostatic configuration were again confirmed.  Starting at the bladder neck and proceeding distally to the verumontanum a transurethral section of the prostate was performed using bipolar using energy of 4 and 5 for cutting and coagulation, respectively.  The procedure began at the bladder neck at the 5 o'clock and 7 o'clock positions and carefully carried distally to the verumontanum, resecting the intervening prostatic adenoma.  Next the left lateral lobe was resected to the level of the transverse capsular fibers.  The identical procedure was performed on the right lobe.  Attention was then directed anteriorly and the resection was completed from the 10 o'clock to 2 o'clock positions.  All bleeding vessels were fulgurated achieving meticulous hemostasis.  The bladder was irrigated with a Toomey syringe, ensuring removal of all prostate chips which were sent to pathology for evaluation.  Having completed the resection and the chips removed, we again confirmed hemostasis with the loop with coagulating current.  Upon completion of the entire procedure, the bladder and posterior urethra were reexamined, confirming open prostatic urethra and bladder neck without evidence of bleeding  or perforation.  Both ureteral orifices and the external sphincter were noted to be intact.  The resectoscope was withdrawn under direct vision and a 22 French three-way Foley catheter with a 30 cc balloon was inserted into the bladder.  The balloon was inflated with 30 cc of sterile water.  After multiple manual irrigations ensuring light pink return of the irrigant, the procedure was terminated.  The catheter was attached to a drainage bag and continuous bladder irrigation was started with normal saline.  The patient was positioned supine.  At the end of the procedure, all counts were correct.  Patient tolerated the procedure well and was taken to the recovery room satisfactory condition.  Plan: Continuous bladder irrigation overnight with gentle Foley traction.  Plan to discharge home tomorrow with Foley catheter in place and void trial in the office in 3 days.  Matt R. English Tomer MD [facility]  Pager: [phone]

## 2023-07-20 NOTE — Discharge Summary (Signed)
Date of admission: 07/20/2023  Date of discharge: 07/20/2023  Admission diagnosis: BPH  Discharge diagnosis: BPH  Secondary diagnoses: None  History and Physical: For full details, please see admission history and physical. Briefly, Steven Padilla is a 73 y.o. year old patient with BPH here for TURP.   Hospital Course: The patient recovered in the usual expected fashion.  He had his diet advanced slowly.  Initially managed with IV pain control, then transitioned to PO meds when he was tolerating oral intake.  His labs were stable throughout the hospital course.  He was discharged to home on POD#1.  At the time of discharge the patient was tolerating a regular diet, passing flatus, ambulating, had adequate pain control and was agreeable to discharge.  Follow up as scheduled.  ***  Laboratory values:  Recent Labs    07/20/23 1030  HGB 14.3  HCT 42.0   Recent Labs    07/20/23 1030  CREATININE 1.60*    Disposition: Home  Discharge instruction: The patient was instructed to be ambulatory but told to refrain from heavy lifting, strenuous activity, or driving. ***  Discharge medications:  Allergies as of 07/20/2023       Reactions   Codeine Anxiety   Flexeril [cyclobenzaprine]    "knocks me out too fast" Patient would take and then fall out of chair.        Medication List     TAKE these medications    aspirin EC 81 MG tablet Take 1 tablet by mouth daily.   atorvastatin 20 MG tablet Commonly known as: LIPITOR Take 1 tablet (20 mg total) by mouth daily.   ezetimibe 10 MG tablet Commonly known as: ZETIA Take 1 tablet (10 mg total) by mouth daily.   finasteride 5 MG tablet Commonly known as: PROSCAR Take 5 mg by mouth at bedtime.   fluticasone 50 MCG/ACT nasal spray Commonly known as: FLONASE Place into both nostrils daily as needed for allergies.   irbesartan 300 MG tablet Commonly known as: AVAPRO Take 300 mg by mouth daily.   multivitamin with minerals  Tabs tablet Take 1 tablet by mouth daily.   PUMPKIN SEED PO Take by mouth.   SAW PALMETTO COMPLEX PO Take by mouth.   sulfamethoxazole-trimethoprim 800-160 MG tablet Commonly known as: BACTRIM DS Take 1 tablet by mouth every 12 (twelve) hours.   tamsulosin 0.4 MG Caps capsule Commonly known as: FLOMAX Take 0.4 mg by mouth every evening.   traMADol 50 MG tablet Commonly known as: Ultram Take 1 tablet (50 mg total) by mouth every 6 (six) hours as needed.   TURMERIC-GINGER PO Take by mouth.        Followup:   Follow-up Information     Jannifer Hick, MD Follow up on 07/23/2023.   Specialty: Urology Why: 9:30AM Contact information: [address] [place] [address] [phone]                 Dineen Kid. Alquan Morrish MD [facility]  Pager: [phone]

## 2023-07-20 NOTE — Anesthesia Preprocedure Evaluation (Addendum)
Anesthesia Evaluation  Patient identified by MRN, date of birth, ID band Patient awake    Reviewed: Allergy & Precautions, NPO status , Patient's Chart, lab work & pertinent test results  History of Anesthesia Complications Negative for: history of anesthetic complications  Airway Mallampati: III  TM Distance: >3 FB Neck ROM: Full   Comment: beard Dental  (+) Dental Advisory Given   Pulmonary neg shortness of breath, neg sleep apnea, neg COPD, neg recent URI, former smoker   Pulmonary exam normal breath sounds clear to auscultation       Cardiovascular hypertension (irbesartan), Pt. on medications (-) angina (-) Past MI, (-) Cardiac Stents and (-) CABG (-) dysrhythmias  Rhythm:Regular Rate:Normal  Right vertebral artery stenosis, bilateral carotid artery stenosis, HLD  Echocardiogram 03/12/2019:  Left ventricle cavity is normal in size. Mild concentric hypertrophy of  the left ventricle. Normal LV systolic function with EF 68%. Normal global  wall motion. Doppler evidence of grade I (impaired) diastolic dysfunction,  normal LAP.  Left atrial cavity is mildly dilated. Aneurysmal interatrial septum  without 2D or color Doppler evidence of interatrial shunt.  Trileaflet aortic valve. Mild aortic valve leaflet calcification. Mild  (Grade I) aortic regurgitation.  Mild pulmonic regurgitation.  No evidence of pulmonary hypertension.     Neuro/Psych negative neurological ROS     GI/Hepatic negative GI ROS, Neg liver ROS,,,  Endo/Other  negative endocrine ROS    Renal/GU CRFRenal disease   BPH    Musculoskeletal  (+) Arthritis ,    Abdominal   Peds  Hematology negative hematology ROS (+)   Anesthesia Other Findings   Reproductive/Obstetrics                             Anesthesia Physical Anesthesia Plan  ASA: 3  Anesthesia Plan: General   Post-op Pain Management: Tylenol PO (pre-op)*    Induction: Intravenous  PONV Risk Score and Plan: 2 and Ondansetron, Dexamethasone and Treatment may vary due to age or medical condition  Airway Management Planned: LMA  Additional Equipment:   Intra-op Plan:   Post-operative Plan: Extubation in OR  Informed Consent: I have reviewed the patients History and Physical, chart, labs and discussed the procedure including the risks, benefits and alternatives for the proposed anesthesia with the patient or authorized representative who has indicated his/her understanding and acceptance.     Dental advisory given  Plan Discussed with: CRNA and Anesthesiologist  Anesthesia Plan Comments: (Risks of general anesthesia discussed including, but not limited to, sore throat, hoarse voice, chipped/damaged teeth, injury to vocal cords, nausea and vomiting, allergic reactions, lung infection, heart attack, stroke, and death. All questions answered. )        Anesthesia Quick Evaluation

## 2023-07-20 NOTE — H&P (Signed)
Office Visit Report     07/03/2023   --------------------------------------------------------------------------------   Steven Padilla  MRN: [id number]  DOB: [birth year], 73 year old Male  SSN: [id number]   PRIMARY CARE:  Penelope Galas, MD  PRIMARY CARE FAX:  [phone]  REFERRING:  Bartholomew Crews, NP  PROVIDER:  Jettie Pagan, M.D.  LOCATION:  [facility] [phone]     --------------------------------------------------------------------------------   CC/HPI: Steven Padilla is a 73 year old male seen in follow-up with a history of BPH with LUTS and urinary urgency as well as prostate cancer screening.   1. BPH/LUTS:  He has a long history of lower urinary tract symptoms and has been seen routinely since 2017. He currently takes tamsulosin 0.4 mg daily. His IPSS score is 15, quality of life 2. He does complain of rare sensation of bladder emptying, urinary frequency, intermittency, urgency, weak flow stream, 2 times nocturia. He was previously placed on Myrbetriq in the past however did not see benefits to stop taking this medication.  -States that over the past 2 years, his symptoms have worsened and is interested in considering a bladder outlet obstruction procedure.  He does have some lingering difficulty with urinary urgency however does not wish to start an anticholinergic or Myrbetriq at this time.  -BPH evaluation in 11/2022: TRUS volume 73 cc, UroCuff with max flow rate 3 mL/second, but highest pressure after flow interruption was 200 cm H2O, pressure flow study was 26%, obstructed on nomogram. PVR 322 mL after voiding 100 mL. Cystoscopy 11/28/2022: Bilobar obstructing prostate.   His wife has breast cancer and is finishing chemoradiation treatment today. Patient is now interested in proceeding with TURP.    2. Prostate cancer screening: He denies family history of prostate cancer. PSA on 10/17/2022 was 2.15.   Patient currently denies fever, chills, sweats, nausea,  vomiting, abdominal or flank pain, gross hematuria or dysuria.   He has a past medical history of hypertension, hyperlipidemia, CKD stage II.     ALLERGIES: Codeine Flexeril TABS    MEDICATIONS: Finasteride 5 mg tablet 1 tablet PO Daily  Tamsulosin Hcl 0.4 mg capsule 2 capsule PO Q HS  Aspir 81  Irbesartan 150 mg tablet  Multi-Day Vitamins  Tramadol Hcl 50 mg tablet     GU PSH: Complex cystometrogram, with voiding pressure studies, any technique - 11/06/2022 Complex Uroflow - 11/06/2022 Cystoscopy - 11/28/2022 Emg surf Electrd - 11/06/2022     NON-GU PSH: Knee Arthroscopy/surgery, Left Remove Gallbladder Rotator Cuff Surgery.., Right Visit Complexity (formerly GPC1X) - 10/17/2022     GU PMH: BPH w/LUTS - 12/25/2022, - 12/12/2022, - 11/28/2022, - 10/17/2022, - 2022, He has an enlarged prostate but it is smooth and benign with a normal PSA of 2.2. He will continue annual prostate cancer screening with DRE and PSA., - 2021, - 2020 (Stable), His prostate is smooth and benign without nodularity or induration. I will continue to perform yearly DRE., - 2018 (Improving), He noted improvement subjectively on alpha blockade therapy. I have recommended he remain on this and will begin tamsulosin., - 2017 (Worsening), - 2017 Urinary Retention - 12/25/2022, - 12/12/2022 Encounter for Prostate Cancer screening (Stable) - 11/28/2022, - 2022 Weak Urinary Stream - 11/28/2022, - 10/17/2022, - 2022 Elevated PSA - 10/17/2022, - 2020 (Stable), He had a transient elevation of his PSA but he has been monitored and remains quite low. I've recommended at this point yearly DRE and PSA., - 2018 (Stable), We discussed the need to evaluate his elevated PSA further.  What I have recommended is we check a PSA today and then continue to follow it closely with a repeat PSA in 3 months and then again 3 months following that in 6 months with a DRE at that time., - 2017 (Worsening), - 2017 Urinary Urgency - 2022, He has BPH and has  significant LUTS if he misses a dose. He is having more urgency and so we discussed increasing the dose of his tamsulosin to see if this is helpful., - 2021 Urinary Hesitancy (Stable), His hesitancy appears to be secondary to BPH as it responded as anticipated to alpha blockade therapy. - 2017    NON-GU PMH: Allergic rhinitis, unspecified Arthritis Encounter for general adult medical examination without abnormal findings, Encounter for preventive health examination Hyperlipidemia, unspecified Hypertension Sleep Apnea Thrombocytopenia, unspecified    FAMILY HISTORY: Aneurysm Of The Heart Wall - Sister, Father Heart Attack - Father, Sister, Grandfather Hypertension - Mother, Sister, Father   SOCIAL HISTORY: Marital Status: Married Preferred Language: English; Ethnicity: Not Hispanic Or Latino; Race: White Current Smoking Status: Patient does not smoke anymore.  Has never drank.  Drinks 1 caffeinated drink per day.    REVIEW OF SYSTEMS:    GU Review Male:   Patient reports frequent urination, hard to postpone urination, get up at night to urinate, leakage of urine, stream starts and stops, and trouble starting your stream. Patient denies burning/ pain with urination, have to strain to urinate , erection problems, and penile pain.  Gastrointestinal (Upper):   Patient denies nausea, vomiting, and indigestion/ heartburn.  Gastrointestinal (Lower):   Patient denies diarrhea and constipation.  Constitutional:   Patient denies fever, night sweats, weight loss, and fatigue.  Skin:   Patient denies skin rash/ lesion and itching.  Eyes:   Patient denies blurred vision and double vision.  Ears/ Nose/ Throat:   Patient denies sore throat and sinus problems.  Hematologic/Lymphatic:   Patient denies swollen glands and easy bruising.  Cardiovascular:   Patient denies leg swelling and chest pains.  Respiratory:   Patient denies shortness of breath and cough.  Endocrine:   Patient denies excessive  thirst.  Musculoskeletal:   Patient denies back pain and joint pain.  Neurological:   Patient denies headaches and dizziness.  Psychologic:   Patient denies depression and anxiety.   VITAL SIGNS: None   MULTI-SYSTEM PHYSICAL EXAMINATION:    Constitutional: Well-nourished. No physical deformities. Normally developed. Good grooming.  Respiratory: No labored breathing, no use of accessory muscles.   Cardiovascular: Normal temperature, normal extremity pulses, no swelling, no varicosities.  Gastrointestinal: No mass, no tenderness, no rigidity, non obese abdomen.     Complexity of Data:  Source Of History:  Patient, Medical Record Summary  Lab Test Review:   PSA  Records Review:   AUA Symptom Score, Previous Doctor Records, Previous Patient Records  Urine Test Review:   Urinalysis   10/17/22 10/13/20 10/02/19 11/15/18 10/02/18 07/04/16 04/17/16 01/14/16  PSA  Total PSA 2.15 ng/mL 1.62 ng/mL 2.2 ng/dl 7.82 ng/dl 9.56 ng/mL 2.13 ng/dl 0.86 ng/dl 5.78     PROCEDURES:         PVR Ultrasound - 46962  Scanned Volume: 351 cc         Visit Complexity - G2211          Urinalysis Dipstick Dipstick Cont'd  Color: Yellow Bilirubin: Neg mg/dL  Appearance: Clear Ketones: Neg mg/dL  Specific Gravity: 9.528 Blood: Neg ery/uL  pH: <=5.0 Protein: Neg mg/dL  Glucose: Neg mg/dL  Urobilinogen: 0.2 mg/dL    Nitrites: Neg    Leukocyte Esterase: Neg leu/uL    ASSESSMENT:      ICD-10 Details  1 GU:   BPH w/LUTS - N40.1   2   Encounter for Prostate Cancer screening - Z12.5   3   Weak Urinary Stream - R39.12    PLAN:           Schedule Return Visit/Planned Activity: Next Available Appointment - Schedule Surgery          Document Letter(s):  Created for Patient: Clinical Summary         Notes:    1. BPH with LUTS: IPSS 15. BPH evaluation with 73 cc prostate, obstructed on UroCuff and on cystoscopy:  -We discussed options for intervention including TURP, HoLEP, urolift.  -He is very  interested in TURP. Provided literature and discussed risk and benefits. He like to schedule. Surgery letter submitted.   Risks and benefits of Transurethral Resection of the Prostate were reviewed in detail including infection, bleeding, blood transfusion, injury to bladder/urethra/surrounding structures, erectile dysfunction, urinary incontinence, bladder neck contracture, persistent obstructive and irritative voiding symptoms, and global anesthesia risks including but not limited to CVA, MI, DVT, PE, pneumonia, and death. He expressed understanding and desire to proceed.    2. Urinary urgency: This is currently stable at this time. He does not want to start Myrbetriq or anticholinergic therapy at this time. Discussed that this can persist after TURP.   3. Prostate cancer screening: PSA normal at 2 in 09/2022. Recommend annual PSA.   CC: Penelope Galas, MD    Urology Preoperative H&P   Chief Complaint: BPH  History of Present Illness: Steven Padilla is a 73 y.o. male with BPH here for TURP. Denies fevers, chills, dysuria.    Past Medical History:  Diagnosis Date   Arthritis    Bilateral carotid artery stenosis    Follows w/ cardiology, Dr. Arnell Sieving.   Bone spur    BPH with elevated PSA    CKD (chronic kidney disease), stage III (HCC)    Follows w/ PCP, Dr. Joylene Draft.   Hyperlipemia    Follows w/ cardiology, Dr. Arnell Sieving.   Hypertension    Follows w/ Dr. Arnell Sieving, cardiologist.   Small bowel obstruction [facility]) 2022   Stenosis of right vertebral artery    Syncope    hx of, Patient states, "improved."   Vision disturbance    Wears glasses     Past Surgical History:  Procedure Laterality Date   CHOLECYSTECTOMY     around 2002 or 2003   COLONOSCOPY     around 2015   CYSTOSCOPY  11/2022   INCISIONAL HERNIA REPAIR N/A 08/18/2020   Procedure: HERNIA REPAIR INCISIONAL W/MESH;  Surgeon: Franky Macho, MD;  Location: [facility];  Service: General;  Laterality: N/A;  pt knows  to arrive at 8:30   KNEE SURGERY Left    1990's   SHOULDER SURGERY Right    1980's, rotator cuff    Allergies:  Allergies  Allergen Reactions   Codeine Anxiety   Flexeril [Cyclobenzaprine]     "knocks me out too fast" Patient would take and then fall out of chair.    Family History  Problem Relation Age of Onset   Hypertension Mother    Hypertension Father    Hypertension Brother    Aneurysm Brother     Social History:  reports that he quit smoking about 42 years ago. His smoking use included cigarettes, pipe,  and cigars. He started smoking about 62 years ago. He has a 40 pack-year smoking history. He has quit using smokeless tobacco.  His smokeless tobacco use included chew. He reports that he does not currently use alcohol. He reports that he does not currently use drugs.  ROS: A complete review of systems was performed.  All systems are negative except for pertinent findings as noted.  Physical Exam:  Vital signs in last 24 hours: Weight:  [117.9 kg] 117.9 kg (01/23 1027) Constitutional:  Alert and oriented, No acute distress Cardiovascular: Regular rate and rhythm Respiratory: Normal respiratory effort, Lungs clear bilaterally GI: Abdomen is soft, nontender, nondistended, no abdominal masses GU: No CVA tenderness Lymphatic: No lymphadenopathy Neurologic: Grossly intact, no focal deficits Psychiatric: Normal mood and affect  Laboratory Data:  No results for input(s): "WBC", "HGB", "HCT", "PLT" in the last 72 hours.  No results for input(s): "NA", "K", "CL", "GLUCOSE", "BUN", "CALCIUM", "CREATININE" in the last 72 hours.  Invalid input(s): "CO3"   No results found for this or any previous visit (from the past 24 hours). No results found for this or any previous visit (from the past 240 hours).  Renal Function: No results for input(s): "CREATININE" in the last 168 hours. CrCl cannot be calculated (Patient's most recent lab result is older than the maximum 21  days allowed.).  Radiologic Imaging: No results found.  I independently reviewed the above imaging studies.  Assessment and Plan Steven Padilla is a 72 y.o. male with BPH here for TURP.  Steven R. Tron Flythe MD 07/20/2023, 9:50 AM  [facility] Pager: [phone]

## 2023-07-21 ENCOUNTER — Encounter (HOSPITAL_BASED_OUTPATIENT_CLINIC_OR_DEPARTMENT_OTHER): Payer: Self-pay | Admitting: Urology

## 2023-07-21 DIAGNOSIS — N138 Other obstructive and reflux uropathy: Secondary | ICD-10-CM | POA: Diagnosis not present

## 2023-07-21 DIAGNOSIS — N182 Chronic kidney disease, stage 2 (mild): Secondary | ICD-10-CM | POA: Diagnosis not present

## 2023-07-21 DIAGNOSIS — R3912 Poor urinary stream: Secondary | ICD-10-CM | POA: Diagnosis not present

## 2023-07-21 DIAGNOSIS — Z79899 Other long term (current) drug therapy: Secondary | ICD-10-CM | POA: Diagnosis not present

## 2023-07-21 DIAGNOSIS — I129 Hypertensive chronic kidney disease with stage 1 through stage 4 chronic kidney disease, or unspecified chronic kidney disease: Secondary | ICD-10-CM | POA: Diagnosis not present

## 2023-07-21 DIAGNOSIS — Z87891 Personal history of nicotine dependence: Secondary | ICD-10-CM | POA: Diagnosis not present

## 2023-07-21 DIAGNOSIS — R3915 Urgency of urination: Secondary | ICD-10-CM | POA: Diagnosis not present

## 2023-07-21 DIAGNOSIS — N401 Enlarged prostate with lower urinary tract symptoms: Secondary | ICD-10-CM | POA: Diagnosis not present

## 2023-07-21 DIAGNOSIS — N411 Chronic prostatitis: Secondary | ICD-10-CM | POA: Diagnosis not present

## 2023-07-21 LAB — CBC
HCT: 39.7 % (ref 39.0–52.0)
Hemoglobin: 12.9 g/dL — ABNORMAL LOW (ref 13.0–17.0)
MCH: 30.9 pg (ref 26.0–34.0)
MCHC: 32.5 g/dL (ref 30.0–36.0)
MCV: 95 fL (ref 80.0–100.0)
Platelets: 156 10*3/uL (ref 150–400)
RBC: 4.18 MIL/uL — ABNORMAL LOW (ref 4.22–5.81)
RDW: 13 % (ref 11.5–15.5)
WBC: 11.5 10*3/uL — ABNORMAL HIGH (ref 4.0–10.5)
nRBC: 0 % (ref 0.0–0.2)

## 2023-07-21 LAB — BASIC METABOLIC PANEL
Anion gap: 8 (ref 5–15)
BUN: 28 mg/dL — ABNORMAL HIGH (ref 8–23)
CO2: 20 mmol/L — ABNORMAL LOW (ref 22–32)
Calcium: 9.2 mg/dL (ref 8.9–10.3)
Chloride: 111 mmol/L (ref 98–111)
Creatinine, Ser: 1.46 mg/dL — ABNORMAL HIGH (ref 0.61–1.24)
GFR, Estimated: 50 mL/min — ABNORMAL LOW (ref >60)
Glucose, Bld: 139 mg/dL — ABNORMAL HIGH (ref 70–99)
Potassium: 4.2 mmol/L (ref 3.5–5.1)
Sodium: 139 mmol/L (ref 135–145)

## 2023-07-23 DIAGNOSIS — R3912 Poor urinary stream: Secondary | ICD-10-CM | POA: Diagnosis not present

## 2023-07-23 LAB — SURGICAL PATHOLOGY

## 2023-07-27 ENCOUNTER — Ambulatory Visit (HOSPITAL_COMMUNITY)
Admission: RE | Admit: 2023-07-27 | Discharge: 2023-07-27 | Disposition: A | Discharge status: Home / Self Care | Payer: Medicare PPO | Source: Ambulatory Visit | Attending: Cardiology | Admitting: Cardiology

## 2023-07-27 DIAGNOSIS — I6501 Occlusion and stenosis of right vertebral artery: Secondary | ICD-10-CM | POA: Diagnosis not present

## 2023-07-27 DIAGNOSIS — I6523 Occlusion and stenosis of bilateral carotid arteries: Secondary | ICD-10-CM | POA: Diagnosis not present

## 2023-08-14 ENCOUNTER — Other Ambulatory Visit: Payer: Self-pay | Admitting: Cardiology

## 2023-08-14 DIAGNOSIS — E782 Mixed hyperlipidemia: Secondary | ICD-10-CM

## 2023-09-03 DIAGNOSIS — H2511 Age-related nuclear cataract, right eye: Secondary | ICD-10-CM | POA: Diagnosis not present

## 2023-09-03 DIAGNOSIS — H35372 Puckering of macula, left eye: Secondary | ICD-10-CM | POA: Diagnosis not present

## 2023-09-06 DIAGNOSIS — R3912 Poor urinary stream: Secondary | ICD-10-CM | POA: Diagnosis not present

## 2023-09-06 DIAGNOSIS — R8279 Other abnormal findings on microbiological examination of urine: Secondary | ICD-10-CM | POA: Diagnosis not present

## 2023-09-07 DIAGNOSIS — E785 Hyperlipidemia, unspecified: Secondary | ICD-10-CM | POA: Diagnosis not present

## 2023-09-07 DIAGNOSIS — M25551 Pain in right hip: Secondary | ICD-10-CM | POA: Diagnosis not present

## 2023-09-07 DIAGNOSIS — E669 Obesity, unspecified: Secondary | ICD-10-CM | POA: Diagnosis not present

## 2023-09-07 DIAGNOSIS — N1832 Chronic kidney disease, stage 3b: Secondary | ICD-10-CM | POA: Diagnosis not present

## 2023-09-07 DIAGNOSIS — Z8249 Family history of ischemic heart disease and other diseases of the circulatory system: Secondary | ICD-10-CM | POA: Diagnosis not present

## 2023-09-07 DIAGNOSIS — D696 Thrombocytopenia, unspecified: Secondary | ICD-10-CM | POA: Diagnosis not present

## 2023-09-07 DIAGNOSIS — I129 Hypertensive chronic kidney disease with stage 1 through stage 4 chronic kidney disease, or unspecified chronic kidney disease: Secondary | ICD-10-CM | POA: Diagnosis not present

## 2023-09-07 DIAGNOSIS — R7301 Impaired fasting glucose: Secondary | ICD-10-CM | POA: Diagnosis not present

## 2023-09-28 DIAGNOSIS — H25811 Combined forms of age-related cataract, right eye: Secondary | ICD-10-CM | POA: Diagnosis not present

## 2023-09-28 DIAGNOSIS — H52221 Regular astigmatism, right eye: Secondary | ICD-10-CM | POA: Diagnosis not present

## 2023-10-31 DIAGNOSIS — H2512 Age-related nuclear cataract, left eye: Secondary | ICD-10-CM | POA: Diagnosis not present

## 2023-11-02 DIAGNOSIS — H25812 Combined forms of age-related cataract, left eye: Secondary | ICD-10-CM | POA: Diagnosis not present

## 2023-12-12 DIAGNOSIS — R350 Frequency of micturition: Secondary | ICD-10-CM | POA: Diagnosis not present

## 2023-12-12 DIAGNOSIS — N401 Enlarged prostate with lower urinary tract symptoms: Secondary | ICD-10-CM | POA: Diagnosis not present

## 2024-03-04 DIAGNOSIS — E785 Hyperlipidemia, unspecified: Secondary | ICD-10-CM | POA: Diagnosis not present

## 2024-03-04 DIAGNOSIS — Z8249 Family history of ischemic heart disease and other diseases of the circulatory system: Secondary | ICD-10-CM | POA: Diagnosis not present

## 2024-03-04 DIAGNOSIS — R7301 Impaired fasting glucose: Secondary | ICD-10-CM | POA: Diagnosis not present

## 2024-03-11 DIAGNOSIS — N1832 Chronic kidney disease, stage 3b: Secondary | ICD-10-CM | POA: Diagnosis not present

## 2024-03-11 DIAGNOSIS — Z8249 Family history of ischemic heart disease and other diseases of the circulatory system: Secondary | ICD-10-CM | POA: Diagnosis not present

## 2024-03-11 DIAGNOSIS — M609 Myositis, unspecified: Secondary | ICD-10-CM | POA: Diagnosis not present

## 2024-03-11 DIAGNOSIS — T466X5A Adverse effect of antihyperlipidemic and antiarteriosclerotic drugs, initial encounter: Secondary | ICD-10-CM | POA: Diagnosis not present

## 2024-03-11 DIAGNOSIS — M62838 Other muscle spasm: Secondary | ICD-10-CM | POA: Diagnosis not present

## 2024-03-11 DIAGNOSIS — I6501 Occlusion and stenosis of right vertebral artery: Secondary | ICD-10-CM | POA: Diagnosis not present

## 2024-03-11 DIAGNOSIS — Z1211 Encounter for screening for malignant neoplasm of colon: Secondary | ICD-10-CM | POA: Diagnosis not present

## 2024-03-11 DIAGNOSIS — Z0000 Encounter for general adult medical examination without abnormal findings: Secondary | ICD-10-CM | POA: Diagnosis not present

## 2024-03-11 DIAGNOSIS — E785 Hyperlipidemia, unspecified: Secondary | ICD-10-CM | POA: Diagnosis not present

## 2024-03-11 DIAGNOSIS — I129 Hypertensive chronic kidney disease with stage 1 through stage 4 chronic kidney disease, or unspecified chronic kidney disease: Secondary | ICD-10-CM | POA: Diagnosis not present

## 2024-03-11 DIAGNOSIS — E669 Obesity, unspecified: Secondary | ICD-10-CM | POA: Diagnosis not present

## 2024-06-12 ENCOUNTER — Other Ambulatory Visit: Payer: Self-pay | Admitting: Cardiology

## 2024-06-12 DIAGNOSIS — E782 Mixed hyperlipidemia: Secondary | ICD-10-CM
